# Patient Record
Sex: Female | Born: 1995 | Race: Black or African American | Hispanic: No | Marital: Single | Smoking: Never smoker
Health system: Southern US, Community
[De-identification: ages and names within clinical notes are randomized; demographics above are authoritative.]

## PROBLEM LIST (undated history)

## (undated) ENCOUNTER — Inpatient Hospital Stay (HOSPITAL_COMMUNITY): Payer: Self-pay

## (undated) DIAGNOSIS — F419 Anxiety disorder, unspecified: Secondary | ICD-10-CM

## (undated) DIAGNOSIS — F32A Depression, unspecified: Secondary | ICD-10-CM

## (undated) DIAGNOSIS — F329 Major depressive disorder, single episode, unspecified: Secondary | ICD-10-CM

## (undated) HISTORY — PX: INDUCED ABORTION: SHX677

---
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
## 2016-01-02 ENCOUNTER — Encounter (HOSPITAL_COMMUNITY): Payer: Self-pay | Admitting: *Deleted

## 2016-01-02 ENCOUNTER — Emergency Department (HOSPITAL_COMMUNITY)
Admission: EM | Admit: 2016-01-02 | Discharge: 2016-01-03 | Disposition: A | Discharge status: Other Acute Inpatient Hospital | Payer: 59 | Attending: Emergency Medicine | Admitting: Emergency Medicine

## 2016-01-02 DIAGNOSIS — T391X4A Poisoning by 4-Aminophenol derivatives, undetermined, initial encounter: Secondary | ICD-10-CM | POA: Diagnosis present

## 2016-01-02 DIAGNOSIS — Z79899 Other long term (current) drug therapy: Secondary | ICD-10-CM | POA: Diagnosis not present

## 2016-01-02 DIAGNOSIS — Z79891 Long term (current) use of opiate analgesic: Secondary | ICD-10-CM | POA: Insufficient documentation

## 2016-01-02 DIAGNOSIS — Z792 Long term (current) use of antibiotics: Secondary | ICD-10-CM | POA: Diagnosis not present

## 2016-01-02 LAB — COMPREHENSIVE METABOLIC PANEL
ALT: 15 U/L (ref 14–54)
ANION GAP: 9 (ref 5–15)
AST: 19 U/L (ref 15–41)
Albumin: 4 g/dL (ref 3.5–5.0)
Alkaline Phosphatase: 87 U/L (ref 38–126)
BILIRUBIN TOTAL: 1 mg/dL (ref 0.3–1.2)
BUN: 6 mg/dL (ref 6–20)
CO2: 25 mmol/L (ref 22–32)
Calcium: 9.5 mg/dL (ref 8.9–10.3)
Chloride: 104 mmol/L (ref 101–111)
Creatinine, Ser: 0.73 mg/dL (ref 0.44–1.00)
GFR calc Af Amer: >60 mL/min (ref >60)
Glucose, Bld: 99 mg/dL (ref 65–99)
POTASSIUM: 3.7 mmol/L (ref 3.5–5.1)
Sodium: 138 mmol/L (ref 135–145)
TOTAL PROTEIN: 7.8 g/dL (ref 6.5–8.1)

## 2016-01-02 LAB — SALICYLATE LEVEL

## 2016-01-02 LAB — HEPATIC FUNCTION PANEL
ALK PHOS: 86 U/L (ref 38–126)
ALT: 16 U/L (ref 14–54)
AST: 17 U/L (ref 15–41)
Albumin: 4 g/dL (ref 3.5–5.0)
TOTAL PROTEIN: 7.6 g/dL (ref 6.5–8.1)
Total Bilirubin: 0.8 mg/dL (ref 0.3–1.2)

## 2016-01-02 LAB — RAPID URINE DRUG SCREEN, HOSP PERFORMED
Amphetamines: NOT DETECTED
BENZODIAZEPINES: NOT DETECTED
Barbiturates: NOT DETECTED
COCAINE: NOT DETECTED
Opiates: NOT DETECTED
Tetrahydrocannabinol: NOT DETECTED

## 2016-01-02 LAB — CBC
HCT: 38.6 % (ref 36.0–46.0)
Hemoglobin: 12.5 g/dL (ref 12.0–15.0)
MCH: 28.2 pg (ref 26.0–34.0)
MCHC: 32.4 g/dL (ref 30.0–36.0)
MCV: 87.1 fL (ref 78.0–100.0)
PLATELETS: 334 10*3/uL (ref 150–400)
RBC: 4.43 MIL/uL (ref 3.87–5.11)
RDW: 13.2 % (ref 11.5–15.5)
WBC: 5.6 10*3/uL (ref 4.0–10.5)

## 2016-01-02 LAB — ACETAMINOPHEN LEVEL
ACETAMINOPHEN (TYLENOL), SERUM: 74 ug/mL — AB (ref 10–30)
Acetaminophen (Tylenol), Serum: 83 ug/mL — ABNORMAL HIGH (ref 10–30)

## 2016-01-02 LAB — ETHANOL

## 2016-01-02 NOTE — ED Provider Notes (Signed)
CSN: [id number]     Arrival date & time 01/02/16  1644 History   First MD Initiated Contact with Patient 01/02/16 2122     Chief Complaint  Patient presents with  . Drug Overdose     (Consider location/radiation/quality/duration/timing/severity/associated sxs/prior Treatment) Patient is a 20 y.o. female presenting with general illness. The history is provided by the patient.  Illness Location:  Drug overdose Severity:  Moderate Onset quality:  Sudden Duration:  4 hours Timing:  Constant Progression:  Unchanged Chronicity:  New Context:  No prior psychiatric diagnoses. Took 9 tablets of Tylenol 650 mg extended release around 4 PM. Reports she took this because she wanted to feel better, denies SI, HI, AVH. Associated symptoms: headaches   Associated symptoms: no abdominal pain, no chest pain, no cough, no diarrhea, no fever, no nausea, no shortness of breath and no vomiting     Past Medical History  Diagnosis Date  . Medical history non-contributory    Past Surgical History  Procedure Laterality Date  . No past surgeries     No family history on file. Social History  Substance Use Topics  . Smoking status: Never Smoker   . Smokeless tobacco: None  . Alcohol Use: No   OB History    Gravida Para Term Preterm AB TAB SAB Ectopic Multiple Living   3 1 1  1 1    0 1     Review of Systems  Constitutional: Negative for fever and chills.  Respiratory: Negative for cough and shortness of breath.   Cardiovascular: Negative for chest pain.  Gastrointestinal: Negative for nausea, vomiting, abdominal pain and diarrhea.  Musculoskeletal: Negative for back pain.  Neurological: Positive for headaches. Negative for light-headedness.  All other systems reviewed and are negative.     Allergies  Review of patient's allergies indicates no known allergies.  Home Medications   Prior to Admission medications   Medication Sig Start Date End Date Taking? Authorizing Provider   amoxicillin-clavulanate (AUGMENTIN) 875-125 MG tablet Take 1 tablet by mouth every 12 (twelve) hours. 11/12/15   Danelle BerryLeisa Tapia, PA-C  ciprofloxacin (CIPRO) 500 MG tablet Take 1 tablet (500 mg total) by mouth 2 (two) times daily. One po bid x 7 days 08/11/15   Zadie Rhineonald Wickline, MD  oxyCODONE-acetaminophen (PERCOCET) 5-325 MG tablet Take 1-2 tablets by mouth every 6 (six) hours as needed for severe pain. 11/12/15   Danelle BerryLeisa Tapia, PA-C  phenazopyridine (PYRIDIUM) 200 MG tablet Take 1 tablet (200 mg total) by mouth 3 (three) times daily. 08/11/15   Zadie Rhineonald Wickline, MD   BP 99/58 mmHg  Pulse 75  Temp(Src) 97.8 F (36.6 C) (Oral)  Resp 18  Ht 5\' 1"  (1.549 m)  Wt 59.109 kg  BMI 24.63 kg/m2  SpO2 100%  LMP 12/18/2015 Physical Exam  Constitutional: She is oriented to person, place, and time. She appears well-developed and well-nourished. No distress.  HENT:  Head: Normocephalic and atraumatic.  Eyes: EOM are normal. Pupils are equal, round, and reactive to light.  Neck: Normal range of motion.  Cardiovascular: Normal rate and regular rhythm.   Pulmonary/Chest: No tachypnea. No respiratory distress.  Abdominal: Soft. Normal appearance. There is no tenderness. There is no rebound and no guarding.  Neurological: She is alert and oriented to person, place, and time. GCS eye subscore is 4. GCS verbal subscore is 5. GCS motor subscore is 6.  Skin: Skin is warm and dry.  Psychiatric: Her speech is normal and behavior is normal. Her affect is inappropriate.  She expresses impulsivity and inappropriate judgment.  Smiling throughout the interview. Denies SI, HI, AVH.    ED Course  Procedures (including critical care time) Labs Review Labs Reviewed  ACETAMINOPHEN LEVEL - Abnormal; Notable for the following:    Acetaminophen (Tylenol), Serum 74 (*)    All other components within normal limits  HEPATIC FUNCTION PANEL - Abnormal; Notable for the following:    Bilirubin, Direct <0.1 (*)    All other  components within normal limits  ACETAMINOPHEN LEVEL - Abnormal; Notable for the following:    Acetaminophen (Tylenol), Serum 83 (*)    All other components within normal limits  ACETAMINOPHEN LEVEL - Abnormal; Notable for the following:    Acetaminophen (Tylenol), Serum 36 (*)    All other components within normal limits  COMPREHENSIVE METABOLIC PANEL  ETHANOL  SALICYLATE LEVEL  CBC  URINE RAPID DRUG SCREEN, HOSP PERFORMED  CBG MONITORING, ED    Imaging Review No results found. I have personally reviewed and evaluated these images and lab results as part of my medical decision-making.   EKG Interpretation   Date/Time:  Tuesday Jan 02 2016 17:34:51 EDT Ventricular Rate:  77 PR Interval:  130 QRS Duration: 78 QT Interval:  360 QTC Calculation: 407 R Axis:   71 Text Interpretation:  Normal sinus rhythm Normal ECG No significant change  since last tracing Confirmed by Bebe Shaggy  MD, Dorinda Hill ([id number]) on 01/02/2016  9:26:51 PM      MDM   Final diagnoses:  Tylenol overdose, undetermined intent, initial encounter    20 year old female with no prior psychiatric diagnoses presenting after a Tylenol overdose. Denies any intentional self-harm. Reports that she has been feeling stressed recently, and was "trying to feel better." Reports taking medication around 4 PM. Four-hour level was 83. Per poison control, given the fact that this was an extended release, get another level at 7 hours. He continues to trend down. Patient continues to be asymptomatic. No other coingestants. LFTs normal.  Patient has poor insight into the scenario. Denies suicidal ideation or audiovisual hallucinations. We'll have psychiatry evaluate the patient    Lindalou Hose, MD 01/03/16 1308  Zadie Rhine, MD 01/04/16 [phone]

## 2016-01-02 NOTE — ED Notes (Signed)
No sharps reg diet ordered for the pt

## 2016-01-02 NOTE — ED Notes (Signed)
Staffing aware of need for sitter, Charge RN aware of pts need for sitter, security to wand pt, belongings removed, pt placed in paper scrubs

## 2016-01-02 NOTE — ED Notes (Signed)
Pt reports taking Tylenol today unknown time today, pt states, "Life is going on. I was just trying to ease my pain. Emotional & distress pain. It is like a whole long situation. It is relationship problems. I don't talk to my parents. I had a boyfriend." pts grandmother, Production designer, theatre/television/filmmanager & co workers are in lobby, pt denies SI, pt aware of plan, pt denies street drug & ETOH use, pt took Tylenol at work, calm & cooperative, A&O x4

## 2016-01-02 NOTE — ED Notes (Signed)
Pts grandmother reports that the pt took x9 Tylenol ER 650 mg tabs, Poison Control to be notified

## 2016-01-02 NOTE — ED Notes (Signed)
Spoke with Alona BeneJoyce at poison control, wants repeat Tylenol level around 11pm. If level does not differ much pt should be fine, does not need any more monitoring.

## 2016-01-02 NOTE — ED Notes (Signed)
EDP at bedside  

## 2016-01-02 NOTE — ED Notes (Signed)
Poison Control notified, this RN spoke with Alona BeneJoyce, recommendation to check liver functions & Acetaminophen level @ 19:00 with goal of liver function tests to be WNL & Tylenol level below 150

## 2016-01-03 ENCOUNTER — Observation Stay (HOSPITAL_COMMUNITY)
Admission: AD | Admit: 2016-01-03 | Discharge: 2016-01-04 | Disposition: A | Discharge status: Home / Self Care | Payer: PRIVATE HEALTH INSURANCE | Source: Intra-hospital | Attending: Family | Admitting: Family

## 2016-01-03 ENCOUNTER — Encounter (HOSPITAL_COMMUNITY): Payer: Self-pay

## 2016-01-03 DIAGNOSIS — F331 Major depressive disorder, recurrent, moderate: Principal | ICD-10-CM | POA: Diagnosis present

## 2016-01-03 DIAGNOSIS — T391X4A Poisoning by 4-Aminophenol derivatives, undetermined, initial encounter: Secondary | ICD-10-CM | POA: Diagnosis not present

## 2016-01-03 LAB — ACETAMINOPHEN LEVEL: Acetaminophen (Tylenol), Serum: 36 ug/mL — ABNORMAL HIGH (ref 10–30)

## 2016-01-03 MED ORDER — ALUM & MAG HYDROXIDE-SIMETH 200-200-20 MG/5ML PO SUSP: 30.0000 mL | ORAL | Status: DC | PRN

## 2016-01-03 MED ORDER — ACETAMINOPHEN 325 MG PO TABS: 650.0000 mg | ORAL_TABLET | Freq: Four times a day (QID) | ORAL | Status: DC | PRN

## 2016-01-03 MED ORDER — MAGNESIUM HYDROXIDE 400 MG/5ML PO SUSP: 30.0000 mL | Freq: Every day | ORAL | Status: DC | PRN

## 2016-01-03 MED ORDER — HYDROXYZINE HCL 25 MG PO TABS: 25.0000 mg | ORAL_TABLET | Freq: Four times a day (QID) | ORAL | Status: DC | PRN

## 2016-01-03 NOTE — Plan of Care (Signed)
[facility] Observation Crisis Plan  Reason for Crisis Plan:  Crisis Stabilization   Plan of Care:  Referral for Inpatient Hospitalization  Family Support:      Current Living Environment:  Living Arrangements: Spouse/significant other, Children, Parent  Insurance:   Hospital Account    Name Acct ID Class Status Primary Coverage   Schaefer Schaefer [id number] BEHAVIORAL HEALTH OBSERVATION Open [employer] [facility] [place] [facility] [employer]        Guarantor Account (for Hospital Account [id number])    Name Relation to Pt Service Area Active? Acct Type   Schaefer Schaefer Self [facility]   Address Phone       [address] [place], [place] [address] [phone](H)          Coverage Information (for Hospital Account [id number])    1. UNITED HE[employer]CARE/UNITED BEH[employer]IORAL HEALTH    F/O Payor/Plan Precert #   [facility]    Subscriber Subscriber #   Schaefer CrosbySmyre, Schaefer [id number]   Address Phone   [address] [place], [place] [address] [phone]       2. [place]    F/O Payor/Plan Precert #   [facility]    Subscriber Subscriber #   Schaefer Schaefer [id number]   Address Phone   [address] [place], [place] [address] [phone]          Legal Guardian:     Primary Care Provider:  No PCP Per Patient  Current Outpatient Providers:  None  Psychiatrist:     Counselor/Therapist:     Compliant with Medications: Pt not prescribed any medications currently.  Additional Information:   Schaefer Schaefer, Schaefer Schaefer 5/17/20171:54 PM

## 2016-01-03 NOTE — ED Notes (Signed)
[facility]

## 2016-01-03 NOTE — Progress Notes (Signed)
[facility] INPATIENT:  Family/Significant Other Suicide Prevention Education  Suicide Prevention Education:  Patient Refusal for Family/Significant Other Suicide Prevention Education: The patient [name] Schaefer has refused to provide written consent for family/significant other to be provided Family/Significant Other Suicide Prevention Education during admission and/or prior to discharge.  Physician notified.  Maurine SimmeringShugart, Denetta Fei M 01/03/2016, 1:53 PM

## 2016-01-03 NOTE — ED Notes (Signed)
Please see downtime documentation.
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
## 2016-01-03 NOTE — ED Notes (Signed)
Patient was given a snack and drink. A regular diet ordered for lunch. 

## 2016-01-03 NOTE — ED Notes (Addendum)
Pt states that she will go home with grandmother-- and move out from boyfriend/baby's father house. When asked if she felt safe where she was, pt took a few minutes to answer-- then said "I guess"-- discussed safety plan for home and moving to grandmother's house. Denies having any plan, or wanting to hurt self-- wants to go home to take care of son.
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
## 2016-01-03 NOTE — ED Notes (Signed)
TTS in progress 

## 2016-01-03 NOTE — Progress Notes (Signed)
Shift Note:  Pt in bed at shift change.  Pt awake and watching TV.  Pt is friendly and cooperative.  Pt denies any pain, SI, HI or AV/H.  Pt seen by provider and prn meds ordered. Pt continuously monitored for safety while on unit except when in bathroom.  Pt remains safe.

## 2016-01-03 NOTE — Progress Notes (Signed)
Pt spoke with psych NP this morning- recommendation [facility].  [facility] 6 [facility], report can be called at [phone], pt can arrive anytime.

## 2016-01-04 DIAGNOSIS — F331 Major depressive disorder, recurrent, moderate: Secondary | ICD-10-CM | POA: Diagnosis not present

## 2016-01-04 NOTE — Progress Notes (Signed)
Nursing Discharge Note  AVS complete and patient signing copy for chart and receiving copy for herself along with instructions for followup. No prescriptions written for patient. Patient also receiving personal property from locker and signing Belongings Sheet received. Patient continuing to deny any SI/HI/AVH. Staff member escorting patient to Lobby where her [name] Schaefer is now here waiting to pick her up.
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
## 2016-01-04 NOTE — [facility] Counselor (Signed)
Spoke with patient this a.m. Patient remains positive, and states that she does have plans to move in with grandmother to help reduce social stressors. Also, pt states she would like to receive therapy [has never attended] this was dicussed and explained with pt and process as well. Patient is willing to use college counseling services at her current university to explore additional resources and receive counseling as well. Also, pt given contact info for [facility] to contact for appointment as well. Gustaf Mccarter K. Sherlon HandingHarris, LCAS-A, LPC-A, [facility]  Counselor 01/04/2016 10:37 AM
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
## 2016-01-04 NOTE — Progress Notes (Signed)
Nursing Note  Provider Claudette Headonrad Withrow in, discussing discharge with patient and two staff members on Unit, states AVS will be ready soon.

## 2016-01-04 NOTE — [facility] Counselor (Signed)
Spoke with grandmother of pt.  per Eugenie BirksWhithrow, NP to confirm and discuss contract for safety. Discussion pertained to safety of environment upon returning home. Grandmother confirmed.  [name] Schaefer, LCAS-A, LPC-A, [facility]  Counselor 01/04/2016 3:39 PM

## 2016-01-04 NOTE — Discharge Instructions (Signed)
[facility] are [facility]. You are also encouraged to [facility] [employer].

## 2016-01-04 NOTE — Progress Notes (Signed)
Nursing Shift Note  Patient has called her Grandmother to see what her schedule is and what her available [facility] is [facility]. Grandmother telling patient that she can pick patient up "by twelve o'clock". [facility] information, who is verbally telling Provider of this info, conversation audible to Nurse.

## 2016-01-04 NOTE — Progress Notes (Signed)
Nursing Shift Note  Patient sitting up in bed, is cheerful, pleasant, cooperative, logical in conversation and content. Patient denies any SI/HI/AVH, just expressing a little bit of nervousness about leaving her significant other with their baby to live with her Grandmother but states she believes this is best all around for her and her future and also her one year old child's.  Patient stating she is ready to be discharged and is exhibiting enthusiasm about her new living plans. Nurse providing emotional support, therapeutic communication and ensuring continuous observation of patient for safety except when patient in the bathroom. Patient remains safe on unit.

## 2016-02-27 ENCOUNTER — Encounter (HOSPITAL_COMMUNITY): Payer: Self-pay | Admitting: *Deleted

## 2016-02-27 ENCOUNTER — Inpatient Hospital Stay (HOSPITAL_COMMUNITY)
Admission: AD | Admit: 2016-02-27 | Discharge: 2016-02-27 | Disposition: A | Discharge status: Home / Self Care | Payer: 59 | Source: Ambulatory Visit | Attending: Obstetrics & Gynecology | Admitting: Obstetrics & Gynecology

## 2016-02-27 DIAGNOSIS — R1030 Lower abdominal pain, unspecified: Secondary | ICD-10-CM | POA: Diagnosis not present

## 2016-02-27 DIAGNOSIS — Z3202 Encounter for pregnancy test, result negative: Secondary | ICD-10-CM | POA: Insufficient documentation

## 2016-02-27 DIAGNOSIS — R109 Unspecified abdominal pain: Secondary | ICD-10-CM | POA: Diagnosis not present

## 2016-02-27 DIAGNOSIS — F418 Other specified anxiety disorders: Secondary | ICD-10-CM | POA: Insufficient documentation

## 2016-02-27 DIAGNOSIS — R300 Dysuria: Secondary | ICD-10-CM

## 2016-02-27 HISTORY — DX: Anxiety disorder, unspecified: F41.9

## 2016-02-27 HISTORY — DX: Major depressive disorder, single episode, unspecified: F32.9

## 2016-02-27 HISTORY — DX: Depression, unspecified: F32.A

## 2016-02-27 LAB — URINALYSIS, ROUTINE W REFLEX MICROSCOPIC
BILIRUBIN URINE: NEGATIVE
GLUCOSE, UA: NEGATIVE mg/dL
HGB URINE DIPSTICK: NEGATIVE
Ketones, ur: NEGATIVE mg/dL
Nitrite: NEGATIVE
PROTEIN: NEGATIVE mg/dL
SPECIFIC GRAVITY, URINE: 1.025 (ref 1.005–1.030)
pH: 6 (ref 5.0–8.0)

## 2016-02-27 LAB — WET PREP, GENITAL
CLUE CELLS WET PREP: NONE SEEN
Sperm: NONE SEEN
TRICH WET PREP: NONE SEEN
YEAST WET PREP: NONE SEEN

## 2016-02-27 LAB — URINE MICROSCOPIC-ADD ON
BACTERIA UA: NONE SEEN
RBC / HPF: NONE SEEN RBC/hpf (ref 0–5)

## 2016-02-27 LAB — POCT PREGNANCY, URINE: Preg Test, Ur: NEGATIVE

## 2016-02-27 MED ORDER — SULFAMETHOXAZOLE-TRIMETHOPRIM 800-160 MG PO TABS: 1.0000 | ORAL_TABLET | Freq: Two times a day (BID) | ORAL | Status: AC

## 2016-02-27 NOTE — MAU Provider Note (Signed)
CSN: [id number]     Arrival date & time 02/27/16  1427 History   None    Chief Complaint  Patient presents with  . Abdominal Pain  . Possible Pregnancy     (Consider location/radiation/quality/duration/timing/severity/associated sxs/prior Treatment) Dysuria  This is a new problem. The current episode started in the past 7 days. The problem occurs every urination. The problem has been gradually worsening. The quality of the pain is described as burning. The pain is at a severity of 8/10. There has been no fever. She is sexually active. There is no history of pyelonephritis. Associated symptoms include a discharge and a possible pregnancy. Pertinent negatives include no chills, nausea, urgency or vomiting. She has tried nothing for the symptoms.   [name] Schaefer is a 20 y.o. [id number] with dysuria, vaginal d/c and lower abdominal pain that started 4 days ago. LMP was 5/17, no birth control. TAB x2 last one in Feb 2017. Last pap smear one year ago and was normal. Hx of trichomonas. Current sex partner x 2 years.   Past Medical History  Diagnosis Date  . Anxiety   . Depression    Past Surgical History  Procedure Laterality Date  . Induced abortion     Family History  Problem Relation Age of Onset  . Diabetes Paternal Grandfather   . Stroke Paternal Grandfather   . Hypertension Paternal Grandfather    Social History  Substance Use Topics  . Smoking status: Never Smoker   . Smokeless tobacco: Never Used  . Alcohol Use: No   OB History    Gravida Para Term Preterm AB TAB SAB Ectopic Multiple Living   3 1 1  1 1    0 1     Review of Systems  Constitutional: Negative for chills.  Gastrointestinal: Positive for abdominal pain. Negative for nausea and vomiting.  Genitourinary: Positive for dysuria and vaginal discharge. Negative for urgency, decreased urine volume and dyspareunia.  all other systems negative    Allergies  Review of patient's allergies indicates no known  allergies.  Home Medications   Prior to Admission medications   Medication Sig Start Date End Date Taking? Authorizing Provider  sulfamethoxazole-trimethoprim (BACTRIM DS,SEPTRA DS) 800-160 MG tablet Take 1 tablet by mouth 2 (two) times daily. 02/27/16 03/05/16  Hope Orlene OchM Neese, NP   BP 109/65 mmHg  Pulse 96  Temp(Src) 99.4 F (37.4 C) (Oral)  Resp 16  Ht 5' (1.524 m)  Wt 126 lb 12.8 oz (57.516 kg)  BMI 24.76 kg/m2  LMP 01/15/2015 Physical Exam  Constitutional: She is oriented to person, place, and time. She appears well-developed and well-nourished. No distress.  HENT:  Head: Normocephalic.  Eyes: Conjunctivae and EOM are normal.  Neck: Neck supple.  Cardiovascular: Normal rate and regular rhythm.   Pulmonary/Chest: Effort normal and breath sounds normal.  Abdominal: Soft. Bowel sounds are normal. There is tenderness in the suprapubic area. There is no rebound, no guarding and no CVA tenderness.  Tenderness is minimal.  Genitourinary:  External genitalia without lesions, large amount of yellow d/c vaginal vault. No CMT, no adnexal tenderness. Uterus without palpable enlargement.   Musculoskeletal: Normal range of motion.  Neurological: She is alert and oriented to person, place, and time. No cranial nerve deficit.  Skin: Skin is warm and dry.  Psychiatric: She has a normal mood and affect. Her behavior is normal.  Nursing note and vitals reviewed.   ED Course  Procedures (including critical care time) Labs Review Results for orders  placed or performed during the hospital encounter of 02/27/16 (from the past 24 hour(s))  Urinalysis, Routine w reflex microscopic (not at [facility])     Status: Abnormal   Collection Time: 02/27/16  2:40 PM  Result Value Ref Range   Color, Urine YELLOW YELLOW   APPearance HAZY (A) CLEAR   Specific Gravity, Urine 1.025 1.005 - 1.030   pH 6.0 5.0 - 8.0   Glucose, UA NEGATIVE NEGATIVE mg/dL   Hgb urine dipstick NEGATIVE NEGATIVE   Bilirubin Urine  NEGATIVE NEGATIVE   Ketones, ur NEGATIVE NEGATIVE mg/dL   Protein, ur NEGATIVE NEGATIVE mg/dL   Nitrite NEGATIVE NEGATIVE   Leukocytes, UA SMALL (A) NEGATIVE  Urine microscopic-add on     Status: Abnormal   Collection Time: 02/27/16  2:40 PM  Result Value Ref Range   Squamous Epithelial / LPF 0-5 (A) NONE SEEN   WBC, UA 6-30 0 - 5 WBC/hpf   RBC / HPF NONE SEEN 0 - 5 RBC/hpf   Bacteria, UA NONE SEEN NONE SEEN   Urine-Other MUCOUS PRESENT   Pregnancy, urine POC     Status: None   Collection Time: 02/27/16  4:17 PM  Result Value Ref Range   Preg Test, Ur NEGATIVE NEGATIVE  Wet prep, genital     Status: Abnormal   Collection Time: 02/27/16  4:40 PM  Result Value Ref Range   Yeast Wet Prep HPF POC NONE SEEN NONE SEEN   Trich, Wet Prep NONE SEEN NONE SEEN   Clue Cells Wet Prep HPF POC NONE SEEN NONE SEEN   WBC, Wet Prep HPF POC MANY (A) NONE SEEN   Sperm NONE SEEN      Imaging Review No results found. I have personally reviewed and evaluated the lab results as part of my medical decision-making.   MDM  20 y.o. female with vaginal d/c and dysuria stable for d/c without pain on pelvic exam, no fever or other signs of PID. Will treat for early UTI and send urine for culture. Discussed with the patient and all questioned fully answered. She will return if any problems arise.   Final diagnoses:  Dysuria

## 2016-02-27 NOTE — MAU Note (Signed)
(  Pt had gone outside.  Another pt went and got her)  Hasn't had her period.  Stings when she pees and is cramping really bad.  Neg HPT

## 2016-02-27 NOTE — Progress Notes (Signed)
Written and verbal d/c instructions given and understanding voiced. 

## 2016-02-27 NOTE — MAU Note (Signed)
E-sign not working in [employer]. Pt given d/c instructions and understanding voiced and then pt d/c home

## 2016-02-27 NOTE — Discharge Instructions (Signed)

## 2016-02-28 LAB — GC/CHLAMYDIA PROBE AMP (~~LOC~~) NOT AT ARMC
CHLAMYDIA, DNA PROBE: NEGATIVE
NEISSERIA GONORRHEA: POSITIVE — AB

## 2016-02-29 LAB — URINE CULTURE

## 2016-03-01 ENCOUNTER — Ambulatory Visit (INDEPENDENT_AMBULATORY_CARE_PROVIDER_SITE_OTHER): Payer: Self-pay

## 2016-03-01 VITALS — BP 110/68 | HR 82

## 2016-03-01 DIAGNOSIS — A549 Gonococcal infection, unspecified: Secondary | ICD-10-CM

## 2016-03-01 DIAGNOSIS — Z202 Contact with and (suspected) exposure to infections with a predominantly sexual mode of transmission: Secondary | ICD-10-CM

## 2016-03-01 MED ORDER — CEFTRIAXONE SODIUM 1 G IJ SOLR
1.0000 g | Freq: Once | INTRAMUSCULAR | Status: AC
  Administered 2016-03-01: 1 g via INTRAMUSCULAR

## 2016-03-01 MED ORDER — AZITHROMYCIN 250 MG PO TABS
1000.0000 mg | ORAL_TABLET | Freq: Once | ORAL | Status: AC
  Administered 2016-03-01: 1000 mg via ORAL

## 2016-03-01 MED ORDER — CEFTRIAXONE SODIUM 250 MG IJ SOLR: 250.0000 mg | Freq: Once | INTRAMUSCULAR | Status: DC

## 2016-03-01 NOTE — Progress Notes (Signed)
Patient presented today for STD treatment. Patient was diagnosis with Gonorrhea. Patient has been given injection and taken her Azithromycin 1 gram. Patient was advised not to have sex for 10 days and to inform all partners. STD card has been completed and fax to the health department.

## 2016-03-01 NOTE — Addendum Note (Signed)
Addended by: Cheree DittoGRAHAM, Tawonna Esquer A on: 03/01/2016 11:16 AM   Modules accepted: Orders

## 2016-03-01 NOTE — Addendum Note (Signed)
Addended by: Cheree DittoGRAHAM, DEMETRICE A on: 03/01/2016 11:19 AM   Modules accepted: Orders

## 2016-03-01 NOTE — Addendum Note (Signed)
Addended by: Cheree DittoGRAHAM, Keenya Matera A on: 03/01/2016 11:52 AM   Modules accepted: Orders, Medications

## 2016-04-26 ENCOUNTER — Encounter: Payer: Self-pay | Admitting: *Deleted

## 2016-05-09 ENCOUNTER — Inpatient Hospital Stay (HOSPITAL_COMMUNITY)
Admission: AD | Admit: 2016-05-09 | Discharge: 2016-05-09 | Disposition: A | Discharge status: Home / Self Care | Payer: 59 | Source: Ambulatory Visit | Attending: Obstetrics & Gynecology | Admitting: Obstetrics & Gynecology

## 2016-05-09 ENCOUNTER — Inpatient Hospital Stay (HOSPITAL_COMMUNITY): Payer: 59

## 2016-05-09 ENCOUNTER — Encounter (HOSPITAL_COMMUNITY): Payer: Self-pay

## 2016-05-09 DIAGNOSIS — Z8249 Family history of ischemic heart disease and other diseases of the circulatory system: Secondary | ICD-10-CM | POA: Diagnosis not present

## 2016-05-09 DIAGNOSIS — Z3201 Encounter for pregnancy test, result positive: Secondary | ICD-10-CM | POA: Insufficient documentation

## 2016-05-09 DIAGNOSIS — Z3A01 Less than 8 weeks gestation of pregnancy: Secondary | ICD-10-CM | POA: Insufficient documentation

## 2016-05-09 DIAGNOSIS — O26891 Other specified pregnancy related conditions, first trimester: Secondary | ICD-10-CM | POA: Diagnosis present

## 2016-05-09 DIAGNOSIS — R102 Pelvic and perineal pain: Secondary | ICD-10-CM | POA: Insufficient documentation

## 2016-05-09 DIAGNOSIS — Z823 Family history of stroke: Secondary | ICD-10-CM | POA: Diagnosis not present

## 2016-05-09 DIAGNOSIS — O209 Hemorrhage in early pregnancy, unspecified: Secondary | ICD-10-CM

## 2016-05-09 DIAGNOSIS — Z8619 Personal history of other infectious and parasitic diseases: Secondary | ICD-10-CM | POA: Insufficient documentation

## 2016-05-09 DIAGNOSIS — O4691 Antepartum hemorrhage, unspecified, first trimester: Secondary | ICD-10-CM | POA: Diagnosis not present

## 2016-05-09 DIAGNOSIS — B9689 Other specified bacterial agents as the cause of diseases classified elsewhere: Secondary | ICD-10-CM

## 2016-05-09 DIAGNOSIS — Z833 Family history of diabetes mellitus: Secondary | ICD-10-CM | POA: Diagnosis not present

## 2016-05-09 DIAGNOSIS — N760 Acute vaginitis: Secondary | ICD-10-CM | POA: Diagnosis not present

## 2016-05-09 DIAGNOSIS — O3680X0 Pregnancy with inconclusive fetal viability, not applicable or unspecified: Secondary | ICD-10-CM | POA: Insufficient documentation

## 2016-05-09 LAB — URINALYSIS, ROUTINE W REFLEX MICROSCOPIC
BILIRUBIN URINE: NEGATIVE
GLUCOSE, UA: NEGATIVE mg/dL
HGB URINE DIPSTICK: NEGATIVE
KETONES UR: NEGATIVE mg/dL
LEUKOCYTES UA: NEGATIVE
Nitrite: NEGATIVE
PH: 6.5 (ref 5.0–8.0)
Protein, ur: NEGATIVE mg/dL
Specific Gravity, Urine: 1.01 (ref 1.005–1.030)

## 2016-05-09 LAB — CBC WITH DIFFERENTIAL/PLATELET
BASOS ABS: 0 10*3/uL (ref 0.0–0.1)
Basophils Relative: 0 %
EOS PCT: 2 %
Eosinophils Absolute: 0.1 10*3/uL (ref 0.0–0.7)
HEMATOCRIT: 35.7 % — AB (ref 36.0–46.0)
Hemoglobin: 12.3 g/dL (ref 12.0–15.0)
LYMPHS PCT: 35 %
Lymphs Abs: 1.9 10*3/uL (ref 0.7–4.0)
MCH: 28.8 pg (ref 26.0–34.0)
MCHC: 34.5 g/dL (ref 30.0–36.0)
MCV: 83.6 fL (ref 78.0–100.0)
Monocytes Absolute: 0.4 10*3/uL (ref 0.1–1.0)
Monocytes Relative: 7 %
Neutro Abs: 3.1 10*3/uL (ref 1.7–7.7)
Neutrophils Relative %: 56 %
Platelets: 247 10*3/uL (ref 150–400)
RBC: 4.27 MIL/uL (ref 3.87–5.11)
RDW: 13.9 % (ref 11.5–15.5)
WBC: 5.4 10*3/uL (ref 4.0–10.5)

## 2016-05-09 LAB — WET PREP, GENITAL
SPERM: NONE SEEN
TRICH WET PREP: NONE SEEN
Yeast Wet Prep HPF POC: NONE SEEN

## 2016-05-09 LAB — POCT PREGNANCY, URINE: Preg Test, Ur: POSITIVE — AB

## 2016-05-09 LAB — HCG, QUANTITATIVE, PREGNANCY: HCG, BETA CHAIN, QUANT, S: 1285 m[IU]/mL — AB (ref <5)

## 2016-05-09 MED ORDER — METRONIDAZOLE 500 MG PO TABS: 500.0000 mg | ORAL_TABLET | Freq: Two times a day (BID) | ORAL | 0 refills | Status: DC

## 2016-05-09 NOTE — Discharge Instructions (Signed)
Return to care  °· If you have heavier bleeding that soaks through more that 2 pads per hour for an hour or more °· If you bleed so much that you feel like you might pass out or you do pass out °· If you have significant abdominal pain that is not improved with Tylenol  °· If you develop a fever > 100.5 ° °

## 2016-05-09 NOTE — MAU Provider Note (Signed)
[facility]    Provider Note   CSN: [id number] Arrival date & time: 05/09/16  1050     History   Chief Complaint Chief Complaint  Patient presents with  . Abdominal Cramping  . Vaginal Bleeding    HPI [name] Schaefer is a 20 y.o. G3P1011 who presents to the MAU with cramping in her lower abdomen. LMP 04/02/16. No birth control. Stopped OC 5/17. Current sex partner x 2 years. Hx of trichomonas. TAB x1.  Abdominal Cramping  This is a new problem. The current episode started in the past 7 days. The onset quality is gradual. The problem occurs intermittently. The problem has been gradually worsening. The pain is located in the RLQ and LLQ. The pain is at a severity of 8/10. The quality of the pain is cramping. The abdominal pain radiates to the back. Associated symptoms include frequency, headaches and nausea. Pertinent negatives include no anorexia, constipation, diarrhea, dysuria, fever, vomiting or weight loss. Nothing aggravates the pain. The pain is relieved by nothing. She has tried acetaminophen for the symptoms. The treatment provided no relief.  Vaginal Bleeding  The patient's primary symptoms include pelvic pain. Associated symptoms include abdominal pain, back pain, frequency, headaches, nausea and rash. Pertinent negatives include no anorexia, constipation, diarrhea, dysuria, fever or vomiting.    Past Medical History:  Diagnosis Date  . Anxiety   . Depression     Patient Active Problem List   Diagnosis Date Noted  . MDD (major depressive disorder), recurrent episode, moderate (HCC) 01/03/2016  . Labor and delivery, indication for care 11/27/2014  . NVD (normal vaginal delivery) 11/27/2014  . Cervical abnormality affecting pregnancy, antepartum   . Cervix, short (affecting pregnancy)   . Preterm contractions 09/02/2014  . Polyhydramnios in second trimester   . Encounter for fetal anatomic survey   . [redacted] weeks gestation of pregnancy   . Cervical shortening      Past Surgical History:  Procedure Laterality Date  . INDUCED ABORTION      OB History    Gravida Para Term Preterm AB Living   3 1 1   1 1    SAB TAB Ectopic Multiple Live Births     1   0 1       Home Medications    Prior to Admission medications   Not on File    Family History Family History  Problem Relation Age of Onset  . Diabetes Paternal Grandfather   . Stroke Paternal Grandfather   . Hypertension Paternal Grandfather     Social History Social History  Substance Use Topics  . Smoking status: Never Smoker  . Smokeless tobacco: Never Used  . Alcohol use No     Allergies   Review of patient's allergies indicates no known allergies.   Review of Systems Review of Systems  Constitutional: Negative for fever and weight loss.  Eyes: Negative for redness and visual disturbance.  Respiratory: Negative for cough and shortness of breath.   Cardiovascular: Negative for chest pain, palpitations and leg swelling.  Gastrointestinal: Positive for abdominal pain and nausea. Negative for anorexia, constipation, diarrhea and vomiting.  Genitourinary: Positive for frequency, pelvic pain and vaginal bleeding. Negative for dysuria.  Musculoskeletal: Positive for back pain.  Skin: Positive for rash.  Neurological: Positive for headaches. Negative for syncope and light-headedness.  Psychiatric/Behavioral: Negative for confusion. The patient is not nervous/anxious.      Physical Exam Updated Vital Signs BP 111/63   Pulse 82  Temp 98.8 F (37.1 C) (Oral)   Resp 18   Ht 5\' 1"  (1.549 m)   Wt 129 lb 12.8 oz (58.9 kg)   LMP 04/07/2016   BMI 24.53 kg/m   Physical Exam  Constitutional: She is oriented to person, place, and time. She appears well-developed and well-nourished. No distress.  HENT:  Head: Normocephalic and atraumatic.  Eyes: EOM are normal.  Neck: Neck supple.  Cardiovascular: Normal rate and regular rhythm.   Pulmonary/Chest: Effort normal and  breath sounds normal.  Abdominal: Soft. She exhibits no distension. There is tenderness in the right lower quadrant, suprapubic area and left lower quadrant. There is no rebound and no guarding.  Genitourinary:  Genitourinary Comments: External genitalia without lesions, white d/c vaginal vault, mild CMT and bilateral adnexal tenderness that is mild. Uterus slightly enlarged.   Musculoskeletal: Normal range of motion.  Neurological: She is alert and oriented to person, place, and time. No cranial nerve deficit.  Skin: Skin is warm and dry.  Psychiatric: She has a normal mood and affect. Her behavior is normal.  Nursing note and vitals reviewed.    ED Treatments / Results  Labs (all labs ordered are listed, but only abnormal results are displayed) Labs Reviewed  WET PREP, GENITAL - Abnormal; Notable for the following:       Result Value   Clue Cells Wet Prep HPF POC PRESENT (*)    WBC, Wet Prep HPF POC FEW (*)    All other components within normal limits  CBC WITH DIFFERENTIAL/PLATELET - Abnormal; Notable for the following:    HCT 35.7 (*)    All other components within normal limits  POCT PREGNANCY, URINE - Abnormal; Notable for the following:    Preg Test, Ur POSITIVE (*)    All other components within normal limits  URINALYSIS, ROUTINE W REFLEX MICROSCOPIC (NOT AT [facility])  RPR  HIV ANTIBODY (ROUTINE TESTING)  HCG, QUANTITATIVE, PREGNANCY  GC/CHLAMYDIA PROBE AMP ([place]) NOT AT [facility]     Radiology US Ob Comp Less 14 Wks  Result Date: 05/09/2016 CLINICAL DATA:  Cramping and spotting for 3 days EXAM: OBSTETRIC <14 WK [place] AND TRANSVAGINAL OB US TECHNIQUE: Both transabdominal and transvaginal ultrasound examinations were performed for complete evaluation of the gestation as well as the maternal uterus, adnexal regions, and pelvic cul-de-sac. Transvaginal technique was performed to assess early pregnancy. COMPARISON:  None. FINDINGS: Intrauterine gestational sac: Present Yolk sac:   Not visualized Embryo:  Absent MSD: 2  mm   4 w   6  d Subchorionic hemorrhage: Mild to moderate subchorionic hemorrhage is noted. Maternal uterus/adnexae: Uterus is otherwise within normal limits. Cystic structure is noted in the left ovary consistent with the corpus luteum cyst. IMPRESSION: Very early gestational sac as described. Fetal pole is not noted at this time. Followup examination is recommended as well as correlation with beta HCG values. Electronically Signed   By: Alcide Clever M.D.   On: 05/09/2016 14:40   US Ob Transvaginal  Result Date: 05/09/2016 CLINICAL DATA:  Cramping and spotting for 3 days EXAM: OBSTETRIC <14 WK [place] AND TRANSVAGINAL OB US TECHNIQUE: Both transabdominal and transvaginal ultrasound examinations were performed for complete evaluation of the gestation as well as the maternal uterus, adnexal regions, and pelvic cul-de-sac. Transvaginal technique was performed to assess early pregnancy. COMPARISON:  None. FINDINGS: Intrauterine gestational sac: Present Yolk sac:  Not visualized Embryo:  Absent MSD: 2  mm   4 w   6  d  Subchorionic hemorrhage: Mild to moderate subchorionic hemorrhage is noted. Maternal uterus/adnexae: Uterus is otherwise within normal limits. Cystic structure is noted in the left ovary consistent with the corpus luteum cyst. IMPRESSION: Very early gestational sac as described. Fetal pole is not noted at this time. Followup examination is recommended as well as correlation with beta HCG values. Electronically Signed   By: Alcide Clever M.D.   On: 05/09/2016 14:40    Procedures Procedures (including critical care time)  Medications Ordered in ED Medications - No data to display   Initial Impression / Assessment and Plan / ED Course  I have reviewed the triage vital signs and the nursing notes.  Pertinent labs & imaging results that were available during my care of the patient were reviewed by me and considered in my medical decision making (see chart for  details).  Clinical Course   Patient with positive pregnancy test and lower abdominal pain stable to await ultrasound and care with another provider.  Care turned over to Judeth Horn, NP @ 1340.   Final Clinical Impressions(s) / ED Diagnoses  A;  1. Pregnancy of unknown anatomic location   2. Pelvic pain affecting pregnancy in first trimester, antepartum   3. Vaginal bleeding in pregnancy, first trimester   4. BV (bacterial vaginosis)     P: Discharge home Return to MAU Saturday for f/u [facility] Discussed reasons to return to MAU RX flagyl for BV

## 2016-05-09 NOTE — MAU Note (Signed)
Pt reports she has had cramping on and off x 3 days and started spotting today. Had a positive HPT last week.

## 2016-05-10 LAB — HIV ANTIBODY (ROUTINE TESTING W REFLEX): HIV SCREEN 4TH GENERATION: NONREACTIVE

## 2016-05-10 LAB — GC/CHLAMYDIA PROBE AMP (~~LOC~~) NOT AT ARMC
Chlamydia: NEGATIVE
Neisseria Gonorrhea: NEGATIVE

## 2016-05-10 LAB — RPR: RPR Ser Ql: NONREACTIVE

## 2016-07-17 ENCOUNTER — Inpatient Hospital Stay (HOSPITAL_COMMUNITY)
Admission: AD | Admit: 2016-07-17 | Discharge: 2016-07-17 | Disposition: A | Discharge status: Home / Self Care | Payer: 59 | Source: Ambulatory Visit | Attending: Obstetrics & Gynecology | Admitting: Obstetrics & Gynecology

## 2016-07-17 ENCOUNTER — Encounter (HOSPITAL_COMMUNITY): Payer: Self-pay | Admitting: *Deleted

## 2016-07-17 DIAGNOSIS — B373 Candidiasis of vulva and vagina: Secondary | ICD-10-CM | POA: Diagnosis not present

## 2016-07-17 DIAGNOSIS — R109 Unspecified abdominal pain: Secondary | ICD-10-CM | POA: Insufficient documentation

## 2016-07-17 DIAGNOSIS — N898 Other specified noninflammatory disorders of vagina: Secondary | ICD-10-CM | POA: Insufficient documentation

## 2016-07-17 DIAGNOSIS — B3731 Acute candidiasis of vulva and vagina: Secondary | ICD-10-CM

## 2016-07-17 DIAGNOSIS — Z0001 Encounter for general adult medical examination with abnormal findings: Secondary | ICD-10-CM | POA: Insufficient documentation

## 2016-07-17 DIAGNOSIS — O26892 Other specified pregnancy related conditions, second trimester: Secondary | ICD-10-CM

## 2016-07-17 LAB — WET PREP, GENITAL
Clue Cells Wet Prep HPF POC: NONE SEEN
Sperm: NONE SEEN
TRICH WET PREP: NONE SEEN

## 2016-07-17 LAB — URINALYSIS, ROUTINE W REFLEX MICROSCOPIC
BILIRUBIN URINE: NEGATIVE
Glucose, UA: NEGATIVE mg/dL
KETONES UR: NEGATIVE mg/dL
NITRITE: NEGATIVE
PH: 7 (ref 5.0–8.0)
PROTEIN: NEGATIVE mg/dL
Specific Gravity, Urine: 1.025 (ref 1.005–1.030)

## 2016-07-17 LAB — URINE MICROSCOPIC-ADD ON
BACTERIA UA: NONE SEEN
RBC / HPF: NONE SEEN RBC/hpf (ref 0–5)

## 2016-07-17 MED ORDER — TERCONAZOLE 0.4 % VA CREA: 1.0000 | TOPICAL_CREAM | Freq: Every day | VAGINAL | 0 refills | Status: DC

## 2016-07-17 NOTE — MAU Provider Note (Signed)
History   Patient [name] Schaefer Is a 20 year old G3P1011 here with backache, cramping and discharge. She has had cramping and backache for two weeks and the discharge started last night. She came in to be checked out.   CSN: [id number]  Arrival date and time: 07/17/16 1140   None     Chief Complaint  Patient presents with  . Abdominal Cramping  . Vaginal Discharge   Abdominal Cramping  This is a chronic problem. The current episode started 1 to 4 weeks ago. The onset quality is gradual. The problem occurs intermittently. The problem has been unchanged. The pain is mild. The quality of the pain is aching. Pertinent negatives include no anorexia, arthralgias, belching, constipation, diarrhea, dysuria, fever, flatus, frequency, headaches, hematochezia, hematuria, melena, myalgias, nausea, vomiting or weight loss. Nothing aggravates the pain. The pain is relieved by nothing. She has tried nothing for the symptoms.  Vaginal Discharge  The patient's primary symptoms include vaginal discharge. The patient's pertinent negatives include no genital itching, genital lesions, genital odor, genital rash, missed menses, pelvic pain or vaginal bleeding. This is a new problem. The current episode started yesterday. The patient is experiencing no pain. She is pregnant. Pertinent negatives include no anorexia, constipation, diarrhea, dysuria, fever, frequency, headaches, hematuria, nausea or vomiting. The vaginal discharge was thick. There has been no bleeding. She has not been passing clots. She has not been passing tissue. Nothing aggravates the symptoms. She has tried nothing for the symptoms.    OB History    Gravida Para Term Preterm AB Living   3 1 1   1 1    SAB TAB Ectopic Multiple Live Births     1   0 1      Past Medical History:  Diagnosis Date  . Anxiety   . Depression     Past Surgical History:  Procedure Laterality Date  . INDUCED ABORTION      Family History  Problem Relation  Age of Onset  . Diabetes Paternal Grandfather   . Stroke Paternal Grandfather   . Hypertension Paternal Grandfather     Social History  Substance Use Topics  . Smoking status: Never Smoker  . Smokeless tobacco: Never Used  . Alcohol use No    Allergies: No Known Allergies  Prescriptions Prior to Admission  Medication Sig Dispense Refill Last Dose  . metroNIDAZOLE (FLAGYL) 500 MG tablet Take 1 tablet (500 mg total) by mouth 2 (two) times daily. 14 tablet 0     Review of Systems  Constitutional: Negative for fever and weight loss.  HENT: Negative.   Eyes: Negative.   Respiratory: Negative.   Cardiovascular: Negative.   Gastrointestinal: Negative.  Negative for anorexia, constipation, diarrhea, flatus, hematochezia, melena, nausea and vomiting.  Genitourinary: Positive for vaginal discharge. Negative for dysuria, frequency, hematuria, missed menses and pelvic pain.  Musculoskeletal: Negative.  Negative for arthralgias and myalgias.  Skin: Negative.   Neurological: Negative for headaches.  Psychiatric/Behavioral: Negative.    Physical Exam   Blood pressure 92/56, pulse 89, temperature 98.5 F (36.9 C), temperature source Oral, resp. rate 16, weight 55.6 kg (122 lb 9.6 oz), last menstrual period 04/07/2016, unknown if currently breastfeeding.  Physical Exam  Constitutional: She appears well-developed.  HENT:  Head: Normocephalic.  Neck: Normal range of motion.  Respiratory: Effort normal. No respiratory distress. She has no wheezes.  GI: Soft. She exhibits no distension and no mass. There is no tenderness. There is no rebound and no guarding.  Genitourinary: Vaginal discharge found.  Genitourinary Comments: External labia erythematous; no lesions present. Vaginal walls erythematous with adherent white discharge. Cervix is erythematous but no discharge. White disharge with no odor present in the vaginal vault.     MAU Course  Procedures  MDM -Pelvic -wet prep -GC CT  cultures   Assessment and Plan  -wet prep shows yeast -discharge home with RX for terconazole cream  Charlesetta GaribaldiKathryn Lorraine Jaslen Adcox CNM 07/17/2016, 1:05 PM

## 2016-07-17 NOTE — Discharge Instructions (Signed)
Abdominal Pain During Pregnancy °Belly (abdominal) pain is common during pregnancy. Most of the time, it is not a serious problem. Other times, it can be a sign that something is wrong with the pregnancy. Always tell your doctor if you have belly pain. °Follow these instructions at home: °Monitor your belly pain for any changes. The following actions may help you feel better: °· Do not have sex (intercourse) or put anything in your vagina until you feel better. °· Rest until your pain stops. °· Drink clear fluids if you feel sick to your stomach (nauseous). Do not eat solid food until you feel better. °· Only take medicine as told by your doctor. °· Keep all doctor visits as told. °Get help right away if: °· You are bleeding, leaking fluid, or pieces of tissue come out of your vagina. °· You have more pain or cramping. °· You keep throwing up (vomiting). °· You have pain when you pee (urinate) or have blood in your pee. °· You have a fever. °· You do not feel your baby moving as much. °· You feel very weak or feel like passing out. °· You have trouble breathing, with or without belly pain. °· You have a very bad headache and belly pain. °· You have fluid leaking from your vagina and belly pain. °· You keep having watery poop (diarrhea). °· Your belly pain does not go away after resting, or the pain gets worse. °This information is not intended to replace advice given to you by your health care provider. Make sure you discuss any questions you have with your health care provider. °Document Released: 07/24/2009 Document Revised: 03/13/2016 Document Reviewed: 03/04/2013 °Elsevier Interactive Patient Education © 2017 [employer] ° °

## 2016-07-17 NOTE — MAU Note (Signed)
Been cramping really, really bad for 2-3 wks.  Has a weird d/c, first noted 2 days ago.

## 2016-07-18 LAB — GC/CHLAMYDIA PROBE AMP (~~LOC~~) NOT AT ARMC
Chlamydia: NEGATIVE
NEISSERIA GONORRHEA: NEGATIVE

## 2017-01-16 ENCOUNTER — Emergency Department (HOSPITAL_COMMUNITY)
Admission: EM | Admit: 2017-01-16 | Discharge: 2017-01-16 | Disposition: A | Discharge status: Home / Self Care | Payer: Self-pay | Attending: Emergency Medicine | Admitting: Emergency Medicine

## 2017-01-16 ENCOUNTER — Encounter (HOSPITAL_COMMUNITY): Payer: Self-pay | Admitting: Emergency Medicine

## 2017-01-16 ENCOUNTER — Emergency Department (HOSPITAL_COMMUNITY): Payer: Self-pay

## 2017-01-16 DIAGNOSIS — Z79899 Other long term (current) drug therapy: Secondary | ICD-10-CM | POA: Insufficient documentation

## 2017-01-16 DIAGNOSIS — R51 Headache: Secondary | ICD-10-CM | POA: Insufficient documentation

## 2017-01-16 DIAGNOSIS — Z23 Encounter for immunization: Secondary | ICD-10-CM | POA: Insufficient documentation

## 2017-01-16 DIAGNOSIS — S032XXA Dislocation of tooth, initial encounter: Secondary | ICD-10-CM | POA: Insufficient documentation

## 2017-01-16 DIAGNOSIS — Y999 Unspecified external cause status: Secondary | ICD-10-CM | POA: Insufficient documentation

## 2017-01-16 DIAGNOSIS — S0083XA Contusion of other part of head, initial encounter: Secondary | ICD-10-CM | POA: Insufficient documentation

## 2017-01-16 DIAGNOSIS — E349 Endocrine disorder, unspecified: Secondary | ICD-10-CM

## 2017-01-16 DIAGNOSIS — O0281 Inappropriate change in quantitative human chorionic gonadotropin (hCG) in early pregnancy: Secondary | ICD-10-CM | POA: Insufficient documentation

## 2017-01-16 DIAGNOSIS — Y9289 Other specified places as the place of occurrence of the external cause: Secondary | ICD-10-CM | POA: Insufficient documentation

## 2017-01-16 DIAGNOSIS — Y939 Activity, unspecified: Secondary | ICD-10-CM | POA: Insufficient documentation

## 2017-01-16 DIAGNOSIS — Z3201 Encounter for pregnancy test, result positive: Secondary | ICD-10-CM | POA: Insufficient documentation

## 2017-01-16 LAB — POC URINE PREG, ED: PREG TEST UR: POSITIVE — AB

## 2017-01-16 LAB — HCG, QUANTITATIVE, PREGNANCY: HCG, BETA CHAIN, QUANT, S: 567 m[IU]/mL — AB (ref <5)

## 2017-01-16 MED ORDER — AMOXICILLIN-POT CLAVULANATE 875-125 MG PO TABS
1.0000 | ORAL_TABLET | Freq: Once | ORAL | Status: AC
  Administered 2017-01-16: 1 via ORAL
  Filled 2017-01-16: qty 1

## 2017-01-16 MED ORDER — TETANUS-DIPHTH-ACELL PERTUSSIS 5-2.5-18.5 LF-MCG/0.5 IM SUSP
0.5000 mL | Freq: Once | INTRAMUSCULAR | Status: AC
  Administered 2017-01-16: 0.5 mL via INTRAMUSCULAR
  Filled 2017-01-16: qty 0.5

## 2017-01-16 MED ORDER — ACETAMINOPHEN 325 MG PO TABS
650.0000 mg | ORAL_TABLET | Freq: Once | ORAL | Status: AC
  Administered 2017-01-16: 650 mg via ORAL
  Filled 2017-01-16: qty 2

## 2017-01-16 MED ORDER — AMOXICILLIN-POT CLAVULANATE 875-125 MG PO TABS: 1.0000 | ORAL_TABLET | Freq: Two times a day (BID) | ORAL | 0 refills | Status: DC
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
## 2017-01-16 NOTE — ED Notes (Signed)
Patient transported to CT 

## 2017-01-16 NOTE — Discharge Instructions (Signed)
Please keep your appointment with Dr. Tonia BroomsIgnelzi tomorrow, June 1, at 8 AM. His office information is listed below. Please make sure to take the disc of your CT and the print out to your appointment. Please fill your Augmentin prescription and take the second dose tomorrow morning. You can take Tylenol for pain control. Your first dose was given in the Emergency Department. You can call [facility] to follow up with the positive pregnancy test in the Emergency Department.

## 2017-02-28 ENCOUNTER — Inpatient Hospital Stay (HOSPITAL_COMMUNITY): Payer: Medicaid Other

## 2017-02-28 ENCOUNTER — Inpatient Hospital Stay (HOSPITAL_COMMUNITY)
Admission: AD | Admit: 2017-02-28 | Discharge: 2017-02-28 | Disposition: A | Discharge status: Home / Self Care | Payer: Medicaid Other | Source: Ambulatory Visit | Attending: Obstetrics & Gynecology | Admitting: Obstetrics & Gynecology

## 2017-02-28 ENCOUNTER — Encounter (HOSPITAL_COMMUNITY): Payer: Self-pay

## 2017-02-28 DIAGNOSIS — R1031 Right lower quadrant pain: Secondary | ICD-10-CM | POA: Diagnosis present

## 2017-02-28 DIAGNOSIS — O26891 Other specified pregnancy related conditions, first trimester: Secondary | ICD-10-CM | POA: Diagnosis not present

## 2017-02-28 DIAGNOSIS — Z3A10 10 weeks gestation of pregnancy: Secondary | ICD-10-CM | POA: Insufficient documentation

## 2017-02-28 DIAGNOSIS — Z3491 Encounter for supervision of normal pregnancy, unspecified, first trimester: Secondary | ICD-10-CM

## 2017-02-28 DIAGNOSIS — O210 Mild hyperemesis gravidarum: Secondary | ICD-10-CM | POA: Insufficient documentation

## 2017-02-28 DIAGNOSIS — O219 Vomiting of pregnancy, unspecified: Secondary | ICD-10-CM

## 2017-02-28 DIAGNOSIS — R109 Unspecified abdominal pain: Secondary | ICD-10-CM | POA: Diagnosis not present

## 2017-02-28 DIAGNOSIS — O26899 Other specified pregnancy related conditions, unspecified trimester: Secondary | ICD-10-CM

## 2017-02-28 LAB — WET PREP, GENITAL
Clue Cells Wet Prep HPF POC: NONE SEEN
SPERM: NONE SEEN
Trich, Wet Prep: NONE SEEN
Yeast Wet Prep HPF POC: NONE SEEN

## 2017-02-28 LAB — URINALYSIS, ROUTINE W REFLEX MICROSCOPIC
BILIRUBIN URINE: NEGATIVE
Bacteria, UA: NONE SEEN
Glucose, UA: NEGATIVE mg/dL
Hgb urine dipstick: NEGATIVE
KETONES UR: 80 mg/dL — AB
Nitrite: NEGATIVE
PH: 6 (ref 5.0–8.0)
Protein, ur: 30 mg/dL — AB
SPECIFIC GRAVITY, URINE: 1.027 (ref 1.005–1.030)

## 2017-02-28 LAB — CBC
HEMATOCRIT: 34.6 % — AB (ref 36.0–46.0)
Hemoglobin: 12.1 g/dL (ref 12.0–15.0)
MCH: 29.8 pg (ref 26.0–34.0)
MCHC: 35 g/dL (ref 30.0–36.0)
MCV: 85.2 fL (ref 78.0–100.0)
Platelets: 220 10*3/uL (ref 150–400)
RBC: 4.06 MIL/uL (ref 3.87–5.11)
RDW: 12.7 % (ref 11.5–15.5)
WBC: 4.3 10*3/uL (ref 4.0–10.5)

## 2017-02-28 LAB — POCT PREGNANCY, URINE
PREG TEST UR: POSITIVE — AB
Preg Test, Ur: POSITIVE — AB

## 2017-02-28 LAB — HCG, QUANTITATIVE, PREGNANCY: HCG, BETA CHAIN, QUANT, S: 157973 m[IU]/mL — AB (ref <5)

## 2017-02-28 LAB — ABO/RH: ABO/RH(D): A POS

## 2017-02-28 MED ORDER — PROMETHAZINE HCL 12.5 MG PO TABS: 12.5000 mg | ORAL_TABLET | Freq: Four times a day (QID) | ORAL | 0 refills | Status: DC | PRN

## 2017-02-28 MED ORDER — PROMETHAZINE HCL 25 MG PO TABS
12.5000 mg | ORAL_TABLET | Freq: Once | ORAL | Status: AC
  Administered 2017-02-28: 12.5 mg via ORAL
  Filled 2017-02-28: qty 1

## 2017-02-28 MED ORDER — PRENATAL VITAMIN 27-0.8 MG PO TABS: 1.0000 | ORAL_TABLET | Freq: Every day | ORAL | 5 refills | Status: DC

## 2017-02-28 NOTE — MAU Note (Signed)
Pt presents to MAU with complaints of lower abdominal pain x 3 days, has had migraines intermittently x 2 days and missed period. First day of last menstrual cycle 10/20/2016. Pt denies vaginal discharge.

## 2017-02-28 NOTE — MAU Provider Note (Signed)
Chief Complaint: Abdominal Pain (missed period)   SUBJECTIVE HPI: [name] Schaefer is a 21 y.o. [id number] at [redacted]w[redacted]d who presents to [facility]missions reporting nausea, headache, RLQ/pelvic pain.  Patient states that she has been having one week of abdominal cramping with intermittent severe abdominal pain, localizing to the right lower pelvic region. She denies any bleeding. She did not know that she was pregnant. She has been having unprotected sex, attempted birth control pills but was not able to remember to be taking them. She denies any unusual vaginal discharge. She states that she has been extremely nauseous for 1 week, unable to eat food and barely able to tolerate liquids. She has had a headache for 2 days. She has a history of 3 prior TAB's, and one vaginal birth. She is not her any medication for her pain or nausea. She denies any fevers or chills. She has been regular with her periods every month, stating her last menstrual period was 12/20/16.     Past Medical History:  Diagnosis Date  . Anxiety   . Depression    OB History  Gravida Para Term Preterm AB Living  5 1 1   3 1   SAB TAB Ectopic Multiple Live Births  1 2   0 1    # Outcome Date GA Lbr Len/2nd Weight Sex Delivery Anes PTL Lv  5 Current           4 Term 11/27/14 [redacted]w[redacted]d 09:17 / 00:30 6 lb 6.8 oz (2.914 kg) M Vag-Spont EPI  LIV     Birth Comments: WNL  3 TAB 07/2013          2 SAB           1 TAB              Past Surgical History:  Procedure Laterality Date  . INDUCED ABORTION     Social History   Social History  . Marital status: Single    Spouse name: N/A  . Number of children: N/A  . Years of education: N/A   Occupational History  . Not on file.   Social History Main Topics  . Smoking status: Never Smoker  . Smokeless tobacco: Never Used  . Alcohol use No  . Drug use: No     Comment: Reports not drinking or using any drugs  . Sexual activity: Yes    Birth control/ protection: None   Other  Topics Concern  . Not on file   Social History Narrative  . No narrative on file   No current facility-administered medications on file prior to encounter.    Current Outpatient Prescriptions on File Prior to Encounter  Medication Sig Dispense Refill  . amoxicillin-clavulanate (AUGMENTIN) 875-125 MG tablet Take 1 tablet by mouth every 12 (twelve) hours. 20 tablet 0  . terconazole (TERAZOL 7) 0.4 % vaginal cream Place 1 applicator vaginally at bedtime. 45 g 0   No Known Allergies  I have reviewed the past Medical Hx, Surgical Hx, Social Hx, Allergies and Medications.   REVIEW OF SYSTEMS  A comprehensive ROS was negative except per HPI.   OBJECTIVE Patient Vitals for the past 24 hrs:  BP Temp Temp src Pulse Resp  02/28/17 1239 108/75 98.3 F (36.8 C) Oral 93 18    PHYSICAL EXAM Constitutional: Well-developed, well-nourished female in no acute distress.  Cardiovascular: normal rate Respiratory: normal rate and effort.  GI: Abd soft, non-tender, non-distended. Pos BS x 4 MS: Extremities nontender, no  edema, normal ROM Neurologic: Alert and oriented x 4.  GU: Neg CVAT. SPECULUM EXAM: NEFG, physiologic discharge, no blood noted, cervix clean BIMANUAL: cervix closed; uterus normal size for gestational age, no adnexal tenderness or masses. No CMT.  LAB RESULTS Results for orders placed or performed during the hospital encounter of 02/28/17 (from the past 24 hour(s))  Pregnancy, urine POC     Status: Abnormal   Collection Time: 02/28/17 12:32 PM  Result Value Ref Range   Preg Test, Ur POSITIVE (A) NEGATIVE  Pregnancy, urine POC     Status: Abnormal   Collection Time: 02/28/17 12:46 PM  Result Value Ref Range   Preg Test, Ur POSITIVE (A) NEGATIVE    IMAGING [place] Ob Comp Less 14 Wks  Result Date: 02/28/2017 CLINICAL DATA:  Abdominal pain in first-trimester pregnancy EXAM: OBSTETRIC <14 WK ULTRASOUND TECHNIQUE: Transabdominal ultrasound was performed for evaluation of the  gestation as well as the maternal uterus and adnexal regions. COMPARISON:  05/09/2016 FINDINGS: Intrauterine gestational sac: Single Yolk sac:  Visualized. Embryo:  Visualized. Cardiac Activity: Visualized. Heart Rate: 164 bpm CRL:   38.1  mm   10 w 5 d                  US EDC: 09/21/2017 Subchorionic hemorrhage:  None visualized. Maternal uterus/adnexae: Symmetric normal appearance of the ovaries. IMPRESSION: 1. Single living intrauterine pregnancy measuring 10 weeks 5 days. 2. No adverse finding. Electronically Signed   By: Marnee SpringJonathon  Watts M.D.   On: 02/28/2017 14:56    MAU COURSE Orders Placed This Encounter  Procedures  . Wet prep, genital  . US OB Comp Less 14 Wks  . Urinalysis, Routine w reflex microscopic  . CBC  . hCG, quantitative, pregnancy  . Pregnancy, urine POC  . Pregnancy, urine POC  . ABO/Rh  . Discharge patient     MDM Plan of care reviewed with patient, including labs and tests ordered and medical treatment. Patient able to tolerate PO diet before discharge. Home with anti-emetics. Return precautions given.    ASSESSMENT 1. Normal intrauterine pregnancy on prenatal ultrasound in first trimester   2. Abdominal pain during pregnancy in first trimester   3. Abdominal cramping affecting pregnancy   4. Nausea and vomiting during pregnancy     PLAN Discharge home in stable condition. Rx for phenergan   Allergies as of 02/28/2017   No Known Allergies     Medication List    STOP taking these medications   amoxicillin-clavulanate 875-125 MG tablet Commonly known as:  AUGMENTIN   terconazole 0.4 % vaginal cream Commonly known as:  TERAZOL 7     TAKE these medications   Prenatal Vitamin 27-0.8 MG Tabs Take 1 tablet by mouth daily.   promethazine 12.5 MG tablet Commonly known as:  PHENERGAN Take 1 tablet (12.5 mg total) by mouth every 6 (six) hours as needed for nausea or vomiting. Take a second tablet if still nauseous.        [name] MowElizabeth Rachell Druckenmiller, DO OB  Fellow 02/28/2017 1:16 PM

## 2017-02-28 NOTE — MAU Note (Signed)
Error with previous note, would not allow an addendum. Pt. States last menstrual cycle was 12/20/2016.

## 2017-02-28 NOTE — Discharge Instructions (Signed)

## 2017-03-03 LAB — GC/CHLAMYDIA PROBE AMP (~~LOC~~) NOT AT ARMC
CHLAMYDIA, DNA PROBE: NEGATIVE
Neisseria Gonorrhea: NEGATIVE

## 2017-05-20 ENCOUNTER — Encounter: Payer: Self-pay | Admitting: Obstetrics & Gynecology

## 2017-05-20 ENCOUNTER — Other Ambulatory Visit (HOSPITAL_COMMUNITY)
Admission: RE | Admit: 2017-05-20 | Discharge: 2017-05-20 | Disposition: A | Discharge status: Home / Self Care | Payer: Medicaid Other | Source: Ambulatory Visit | Attending: Obstetrics & Gynecology | Admitting: Obstetrics & Gynecology

## 2017-05-20 ENCOUNTER — Ambulatory Visit (INDEPENDENT_AMBULATORY_CARE_PROVIDER_SITE_OTHER): Payer: Medicaid Other | Admitting: Obstetrics & Gynecology

## 2017-05-20 DIAGNOSIS — O0992 Supervision of high risk pregnancy, unspecified, second trimester: Secondary | ICD-10-CM

## 2017-05-20 DIAGNOSIS — O0990 Supervision of high risk pregnancy, unspecified, unspecified trimester: Secondary | ICD-10-CM | POA: Diagnosis present

## 2017-05-20 DIAGNOSIS — Z3482 Encounter for supervision of other normal pregnancy, second trimester: Secondary | ICD-10-CM

## 2017-05-20 NOTE — Patient Instructions (Signed)

## 2017-05-20 NOTE — Progress Notes (Signed)
  Subjective:    [name] Schaefer is a [id number] [redacted]w[redacted]d being seen today for her first obstetrical visit.  Her obstetrical history is significant for late to care, with history of short cervix last pregnancy. Patient is undecided about intention to breast feed. Pregnancy history fully reviewed.  Patient reports no complaints.  Vitals:   05/20/17 1451 05/20/17 1453  BP: 111/72   Pulse: 82   Weight: 127 lb (57.6 kg)   Height:   (1.549 m)    HISTORY: OB History  Gravida Para Term Preterm AB Living  SAB TAB Ectopic Multiple Live Births  1 2   0 1    # Outcome Date GA Lbr Len/2nd Weight Sex Delivery Anes PTL Lv  5 Current           4 Term 11/27/14 [redacted]w[redacted]d 09:17 / 00:30 6 lb 6.8 oz (2.914 kg) M Vag-Spont EPI  LIV     Birth Comments: WNL  3 TAB 07/2013          2 SAB           1 TAB              Past Medical History:  Diagnosis Date  . Anxiety   . Depression    Past Surgical History:  Procedure Laterality Date  . INDUCED ABORTION     Family History  Problem Relation Age of Onset  . Hypertension Father   . Diabetes Father   . Diabetes Paternal Grandfather   . Stroke Paternal Grandfather   . Hypertension Paternal Grandfather   . Diabetes Maternal Grandmother   . Hypertension Maternal Grandmother   . Diabetes Maternal Grandfather   . Hypertension Maternal Grandfather   . Diabetes Paternal Grandmother   . Hypertension Paternal Grandmother      Exam    Uterus:  Fundal Height: 21 cm  Pelvic Exam:    Perineum: No Hemorrhoids   Vulva: normal   Vagina:  normal mucosa   pH:    Cervix: no lesions   Adnexa: no mass, fullness, tenderness   Bony Pelvis: average  System: Breast:  normal appearance, no masses or tenderness   Skin: normal coloration and turgor, no rashes    Neurologic: oriented, normal mood   Extremities: normal strength, tone, and muscle mass   HEENT PERRLA, extra ocular movement intact, sclera clear, anicteric, oropharynx clear, no  lesions and thyroid without masses   Mouth/Teeth mucous membranes moist, pharynx normal without lesions and dental hygiene good   Neck supple and no masses   Cardiovascular: regular rate and rhythm   Respiratory:  appears well, vitals normal, no respiratory distress, acyanotic, normal RR, ear and throat exam is normal, neck free of mass or lymphadenopathy   Abdomen: soft, non-tender; bowel sounds normal; no masses,  no organomegaly   Urinary: urethral meatus normal      Assessment:    Pregnancy: [id number] Patient Active Problem List   Diagnosis Date Noted  . Supervision of high risk pregnancy, antepartum 05/20/2017        Plan:     Initial labs drawn. Prenatal vitamins. Problem list reviewed and updated. Genetic Screening discussed Quad Screen: ordered.  Ultrasound discussed; fetal survey: ordered.  Follow up in 4 weeks. 50% of 30 min visit spent on counseling and coordination of care.     Scheryl Darter 05/20/2017

## 2017-05-20 NOTE — Progress Notes (Signed)
Pt c/o cramping lower left and right abdomen

## 2017-05-21 LAB — CERVICOVAGINAL ANCILLARY ONLY
CHLAMYDIA, DNA PROBE: NEGATIVE
NEISSERIA GONORRHEA: NEGATIVE

## 2017-05-22 LAB — AFP TETRA
DIA Mom Value: 0.9
DIA Value (EIA): 240.48 pg/mL
DSR (By Age)    1 IN: 1156
DSR (SECOND TRIMESTER) 1 IN: 10000
GESTATIONAL AGE AFP: 21.4 wk
MSAFP Mom: 0.91
MSAFP: 75.6 ng/mL
MSHCG Mom: 0.32
MSHCG: 8270 m[IU]/mL
Maternal Age At EDD: 20.5 yr
OSB RISK: 10000
T18 (By Age): 1:4505 {titer}
TEST RESULTS AFP: NEGATIVE
UE3 MOM: 0.78
WEIGHT: 127 [lb_av]
uE3 Value: 1.79 ng/mL

## 2017-05-22 LAB — OBSTETRIC PANEL, INCLUDING HIV
ANTIBODY SCREEN: NEGATIVE
BASOS: 0 %
Basophils Absolute: 0 10*3/uL (ref 0.0–0.2)
EOS (ABSOLUTE): 0.1 10*3/uL (ref 0.0–0.4)
EOS: 1 %
HEMOGLOBIN: 12 g/dL (ref 11.1–15.9)
HEP B S AG: NEGATIVE
HIV SCREEN 4TH GENERATION: NONREACTIVE
Hematocrit: 35.4 % (ref 34.0–46.6)
IMMATURE GRANS (ABS): 0 10*3/uL (ref 0.0–0.1)
Immature Granulocytes: 1 %
LYMPHS ABS: 1.6 10*3/uL (ref 0.7–3.1)
Lymphs: 28 %
MCH: 29.4 pg (ref 26.6–33.0)
MCHC: 33.9 g/dL (ref 31.5–35.7)
MCV: 87 fL (ref 79–97)
MONOCYTES: 7 %
MONOS ABS: 0.4 10*3/uL (ref 0.1–0.9)
NEUTROS ABS: 3.5 10*3/uL (ref 1.4–7.0)
NEUTROS PCT: 63 %
PLATELETS: 206 10*3/uL (ref 150–379)
RBC: 4.08 x10E6/uL (ref 3.77–5.28)
RDW: 13.9 % (ref 12.3–15.4)
RH TYPE: POSITIVE
RPR: NONREACTIVE
Rubella Antibodies, IGG: 10.7 index (ref >0.99)
WBC: 5.5 10*3/uL (ref 3.4–10.8)

## 2017-05-22 LAB — CULTURE, OB URINE

## 2017-05-22 LAB — HEMOGLOBINOPATHY EVALUATION
HGB C: 0 %
HGB S: 0 %
HGB VARIANT: 0 %
Hemoglobin A2 Quantitation: 2.4 % (ref 1.8–3.2)
Hemoglobin F Quantitation: 0 % (ref 0.0–2.0)
Hgb A: 97.6 % (ref 96.4–98.8)

## 2017-05-22 LAB — URINE CULTURE, OB REFLEX

## 2017-05-27 ENCOUNTER — Ambulatory Visit (HOSPITAL_COMMUNITY)
Admission: RE | Admit: 2017-05-27 | Discharge: 2017-05-27 | Disposition: A | Discharge status: Home / Self Care | Payer: Medicaid Other | Source: Ambulatory Visit | Attending: Obstetrics & Gynecology | Admitting: Obstetrics & Gynecology

## 2017-05-27 ENCOUNTER — Encounter (HOSPITAL_COMMUNITY): Payer: Self-pay

## 2017-05-27 ENCOUNTER — Other Ambulatory Visit: Payer: Self-pay | Admitting: Obstetrics & Gynecology

## 2017-05-27 DIAGNOSIS — I498 Other specified cardiac arrhythmias: Secondary | ICD-10-CM

## 2017-05-27 DIAGNOSIS — O0990 Supervision of high risk pregnancy, unspecified, unspecified trimester: Secondary | ICD-10-CM

## 2017-05-27 DIAGNOSIS — O09292 Supervision of pregnancy with other poor reproductive or obstetric history, second trimester: Secondary | ICD-10-CM

## 2017-05-27 DIAGNOSIS — Z3689 Encounter for other specified antenatal screening: Secondary | ICD-10-CM | POA: Diagnosis not present

## 2017-05-27 DIAGNOSIS — Z3A22 22 weeks gestation of pregnancy: Secondary | ICD-10-CM | POA: Insufficient documentation

## 2017-05-28 ENCOUNTER — Other Ambulatory Visit: Payer: Self-pay | Admitting: Obstetrics & Gynecology

## 2017-05-28 ENCOUNTER — Other Ambulatory Visit (HOSPITAL_COMMUNITY): Payer: Self-pay | Admitting: *Deleted

## 2017-05-28 DIAGNOSIS — O0990 Supervision of high risk pregnancy, unspecified, unspecified trimester: Secondary | ICD-10-CM

## 2017-05-28 DIAGNOSIS — O09292 Supervision of pregnancy with other poor reproductive or obstetric history, second trimester: Secondary | ICD-10-CM

## 2017-05-28 DIAGNOSIS — O26879 Cervical shortening, unspecified trimester: Secondary | ICD-10-CM

## 2017-05-28 DIAGNOSIS — I498 Other specified cardiac arrhythmias: Secondary | ICD-10-CM

## 2017-06-10 ENCOUNTER — Encounter (HOSPITAL_COMMUNITY): Payer: Self-pay

## 2017-06-10 ENCOUNTER — Ambulatory Visit (HOSPITAL_COMMUNITY)
Admission: RE | Admit: 2017-06-10 | Discharge: 2017-06-10 | Disposition: A | Discharge status: Home / Self Care | Payer: Medicaid Other | Source: Ambulatory Visit | Attending: Obstetrics & Gynecology | Admitting: Obstetrics & Gynecology

## 2017-06-17 ENCOUNTER — Ambulatory Visit (INDEPENDENT_AMBULATORY_CARE_PROVIDER_SITE_OTHER): Payer: Medicaid Other | Admitting: Obstetrics and Gynecology

## 2017-06-17 DIAGNOSIS — O0992 Supervision of high risk pregnancy, unspecified, second trimester: Secondary | ICD-10-CM

## 2017-06-17 DIAGNOSIS — O26872 Cervical shortening, second trimester: Secondary | ICD-10-CM

## 2017-06-17 DIAGNOSIS — O0990 Supervision of high risk pregnancy, unspecified, unspecified trimester: Secondary | ICD-10-CM

## 2017-06-17 DIAGNOSIS — O26879 Cervical shortening, unspecified trimester: Secondary | ICD-10-CM | POA: Insufficient documentation

## 2017-06-17 NOTE — Progress Notes (Signed)
Subjective:  [name] Schaefer is a 21 y.o. [id number] at 786w4d being seen today for ongoing prenatal care.  She is currently monitored for the following issues for this high-risk pregnancy and has Supervision of high risk pregnancy, antepartum and Cervical shortening on her problem list.  Patient reports no complaints.  Contractions: Not present. Vag. Bleeding: None.  Movement: Present. Denies leaking of fluid.   The following portions of the patient's history were reviewed and updated as appropriate: allergies, current medications, past family history, past medical history, past social history, past surgical history and problem list. Problem list updated.  Objective:   Vitals:   06/17/17 1103  BP: 113/67  Pulse: 91  Weight: 132 lb (59.9 kg)    Fetal Status: Fetal Heart Rate (bpm): 142   Movement: Present     General:  Alert, oriented and cooperative. Patient is in no acute distress.  Skin: Skin is warm and dry. No rash noted.   Cardiovascular: Normal heart rate noted  Respiratory: Normal respiratory effort, no problems with respiration noted  Abdomen: Soft, gravid, appropriate for gestational age. Pain/Pressure: Present     Pelvic:  Cervical exam deferred        Extremities: Normal range of motion.  Edema: None  Mental Status: Normal mood and affect. Normal behavior. Normal judgment and thought content.   Urinalysis:      Assessment and Plan:  Pregnancy: G5P1031 at 5486w4d  1. Supervision of high risk pregnancy, antepartum Stable Declined flu vaccine  2. Cervical shortening in second trimester F/U U/S for CL this week  Preterm labor symptoms and general obstetric precautions including but not limited to vaginal bleeding, contractions, leaking of fluid and fetal movement were reviewed in detail with the patient. Please refer to After Visit Summary for other counseling recommendations.  Return in about 3 weeks (around 07/08/2017) for OB visit.   Hermina StaggersErvin, Michael L, MD

## 2017-06-19 ENCOUNTER — Ambulatory Visit (HOSPITAL_COMMUNITY)
Admission: RE | Admit: 2017-06-19 | Discharge: 2017-06-19 | Disposition: A | Discharge status: Home / Self Care | Payer: Medicaid Other | Source: Ambulatory Visit | Attending: Maternal and Fetal Medicine | Admitting: Maternal and Fetal Medicine

## 2017-06-26 ENCOUNTER — Encounter (HOSPITAL_COMMUNITY): Payer: Self-pay

## 2017-06-26 ENCOUNTER — Ambulatory Visit (HOSPITAL_COMMUNITY)
Admission: RE | Admit: 2017-06-26 | Discharge: 2017-06-26 | Disposition: A | Discharge status: Home / Self Care | Payer: Medicaid Other | Source: Ambulatory Visit | Attending: Obstetrics & Gynecology | Admitting: Obstetrics & Gynecology

## 2017-06-26 ENCOUNTER — Other Ambulatory Visit (HOSPITAL_COMMUNITY): Payer: Self-pay | Admitting: Maternal and Fetal Medicine

## 2017-06-26 DIAGNOSIS — Z3686 Encounter for antenatal screening for cervical length: Secondary | ICD-10-CM | POA: Diagnosis not present

## 2017-06-26 DIAGNOSIS — Z3A26 26 weeks gestation of pregnancy: Secondary | ICD-10-CM

## 2017-06-26 DIAGNOSIS — O0930 Supervision of pregnancy with insufficient antenatal care, unspecified trimester: Secondary | ICD-10-CM

## 2017-06-26 DIAGNOSIS — O26872 Cervical shortening, second trimester: Secondary | ICD-10-CM | POA: Insufficient documentation

## 2017-06-26 DIAGNOSIS — O0932 Supervision of pregnancy with insufficient antenatal care, second trimester: Secondary | ICD-10-CM | POA: Diagnosis not present

## 2017-06-26 DIAGNOSIS — O26879 Cervical shortening, unspecified trimester: Secondary | ICD-10-CM

## 2017-07-08 ENCOUNTER — Encounter: Payer: Self-pay | Admitting: Obstetrics and Gynecology

## 2017-07-08 ENCOUNTER — Encounter: Payer: Self-pay | Admitting: Obstetrics

## 2017-07-08 ENCOUNTER — Other Ambulatory Visit: Payer: Medicaid Other

## 2017-07-28 ENCOUNTER — Other Ambulatory Visit: Payer: Medicaid Other

## 2017-07-28 ENCOUNTER — Encounter: Payer: Self-pay | Admitting: Obstetrics

## 2017-08-01 ENCOUNTER — Other Ambulatory Visit: Payer: Medicaid Other

## 2017-08-01 ENCOUNTER — Encounter: Payer: Self-pay | Admitting: Obstetrics

## 2017-08-01 ENCOUNTER — Ambulatory Visit (INDEPENDENT_AMBULATORY_CARE_PROVIDER_SITE_OTHER): Payer: Medicaid Other | Admitting: Obstetrics

## 2017-08-01 VITALS — BP 113/72 | HR 92 | Wt 136.8 lb

## 2017-08-01 DIAGNOSIS — G8929 Other chronic pain: Secondary | ICD-10-CM

## 2017-08-01 DIAGNOSIS — O26879 Cervical shortening, unspecified trimester: Secondary | ICD-10-CM

## 2017-08-01 DIAGNOSIS — O26873 Cervical shortening, third trimester: Secondary | ICD-10-CM

## 2017-08-01 DIAGNOSIS — M545 Low back pain, unspecified: Secondary | ICD-10-CM

## 2017-08-01 DIAGNOSIS — O0993 Supervision of high risk pregnancy, unspecified, third trimester: Secondary | ICD-10-CM

## 2017-08-01 DIAGNOSIS — O0990 Supervision of high risk pregnancy, unspecified, unspecified trimester: Secondary | ICD-10-CM

## 2017-08-01 MED ORDER — COMFORT FIT MATERNITY SUPP SM MISC: 0 refills | Status: DC

## 2017-08-01 NOTE — Progress Notes (Signed)
Pt denies concerns at this time. 

## 2017-08-01 NOTE — Progress Notes (Signed)
Subjective:  [name] Schaefer is a 21 y.o. U1L2440G5P1031 at 3649w0d being seen today for ongoing prenatal care.  She is currently monitored for the following issues for this high-risk pregnancy and has Supervision of high risk pregnancy, antepartum and Cervical shortening on their problem list.  Patient reports backache.  Contractions: Not present. Vag. Bleeding: None.  Movement: Present. Denies leaking of fluid.   The following portions of the patient's history were reviewed and updated as appropriate: allergies, current medications, past family history, past medical history, past social history, past surgical history and problem list. Problem list updated.  Objective:   Vitals:   08/01/17 0914  BP: 113/72  Pulse: 92  Weight: 136 lb 12.8 oz (62.1 kg)    Fetal Status: Fetal Heart Rate (bpm): 140   Movement: Present     General:  Alert, oriented and cooperative. Patient is in no acute distress.  Skin: Skin is warm and dry. No rash noted.   Cardiovascular: Normal heart rate noted  Respiratory: Normal respiratory effort, no problems with respiration noted  Abdomen: Soft, gravid, appropriate for gestational age. Pain/Pressure: Absent     Pelvic:  Cervical exam deferred        Extremities: Normal range of motion.  Edema: None  Mental Status: Normal mood and affect. Normal behavior. Normal judgment and thought content.   Urinalysis:      Assessment and Plan:  Pregnancy: G5P1031 at 9349w0d  1. Supervision of high risk pregnancy, antepartum - stable.  No uterine activity.  2. Antepartum cervical shortening Rx: - US MFM OB FOLLOW UP; Future - cervical length  3. Chronic midline low back pain without sciatica Rx: - Elastic Bandages & Supports (COMFORT FIT MATERNITY SUPP SM) MISC; Wear as directed.  Dispense: 1 each; Refill: 0  Preterm labor symptoms and general obstetric precautions including but not limited to vaginal bleeding, contractions, leaking of fluid and fetal movement were reviewed  in detail with the patient. Please refer to After Visit Summary for other counseling recommendations.  Return in about 2 weeks (around 08/15/2017) for ROB.   Brock BadHarper, Charles A, MD

## 2017-08-01 NOTE — Addendum Note (Signed)
Addended by: Lear NgMARTIN, MISTY L on: 08/01/2017 10:08 AM   Modules accepted: Orders

## 2017-08-02 LAB — CBC
HEMOGLOBIN: 11.1 g/dL (ref 11.1–15.9)
Hematocrit: 32.9 % — ABNORMAL LOW (ref 34.0–46.6)
MCH: 28.8 pg (ref 26.6–33.0)
MCHC: 33.7 g/dL (ref 31.5–35.7)
MCV: 86 fL (ref 79–97)
Platelets: 169 10*3/uL (ref 150–379)
RBC: 3.85 x10E6/uL (ref 3.77–5.28)
RDW: 14 % (ref 12.3–15.4)
WBC: 6 10*3/uL (ref 3.4–10.8)

## 2017-08-02 LAB — GLUCOSE TOLERANCE, 2 HOURS W/ 1HR
Glucose, 1 hour: 127 mg/dL (ref 65–179)
Glucose, 2 hour: 112 mg/dL (ref 65–152)
Glucose, Fasting: 72 mg/dL (ref 65–91)

## 2017-08-02 LAB — RPR: RPR: NONREACTIVE

## 2017-08-02 LAB — HIV ANTIBODY (ROUTINE TESTING W REFLEX): HIV Screen 4th Generation wRfx: NONREACTIVE

## 2017-08-06 ENCOUNTER — Encounter: Payer: Self-pay | Admitting: Obstetrics

## 2017-08-11 ENCOUNTER — Ambulatory Visit (HOSPITAL_COMMUNITY)
Admission: RE | Admit: 2017-08-11 | Discharge: 2017-08-11 | Disposition: A | Discharge status: Home / Self Care | Payer: Medicaid Other | Source: Ambulatory Visit | Attending: Obstetrics | Admitting: Obstetrics

## 2017-08-15 ENCOUNTER — Ambulatory Visit (INDEPENDENT_AMBULATORY_CARE_PROVIDER_SITE_OTHER): Payer: Medicaid Other | Admitting: Obstetrics

## 2017-08-15 ENCOUNTER — Encounter: Payer: Self-pay | Admitting: Obstetrics

## 2017-08-15 VITALS — BP 109/70 | HR 99 | Wt 137.2 lb

## 2017-08-15 DIAGNOSIS — O0993 Supervision of high risk pregnancy, unspecified, third trimester: Secondary | ICD-10-CM

## 2017-08-15 DIAGNOSIS — O0990 Supervision of high risk pregnancy, unspecified, unspecified trimester: Secondary | ICD-10-CM

## 2017-08-15 DIAGNOSIS — O26879 Cervical shortening, unspecified trimester: Secondary | ICD-10-CM

## 2017-08-15 DIAGNOSIS — M545 Low back pain, unspecified: Secondary | ICD-10-CM

## 2017-08-15 DIAGNOSIS — O26873 Cervical shortening, third trimester: Secondary | ICD-10-CM

## 2017-08-15 NOTE — Progress Notes (Signed)
Patient reports good fetal movement, denies pain. 

## 2017-08-15 NOTE — Progress Notes (Signed)
Subjective:  Shaquia R Chucky MaySmyre is a 21 y.o. [id number] at 6840w0d being seen today for ongoing prenatal care.  She is currently monitored for the following issues for this high-risk pregnancy and has Supervision of high risk pregnancy, antepartum and Cervical shortening on their problem list.  Patient reports backache.  Contractions: Not present. Vag. Bleeding: None.  Movement: Present. Denies leaking of fluid.   The following portions of the patient's history were reviewed and updated as appropriate: allergies, current medications, past family history, past medical history, past social history, past surgical history and problem list. Problem list updated.  Objective:   Vitals:   08/15/17 1115  BP: 109/70  Pulse: 99  Weight: 137 lb 3.2 oz (62.2 kg)    Fetal Status: Fetal Heart Rate (bpm): 140   Movement: Present     General:  Alert, oriented and cooperative. Patient is in no acute distress.  Skin: Skin is warm and dry. No rash noted.   Cardiovascular: Normal heart rate noted  Respiratory: Normal respiratory effort, no problems with respiration noted  Abdomen: Soft, gravid, appropriate for gestational age. Pain/Pressure: Absent     Pelvic:  Cervical exam deferred        Extremities: Normal range of motion.  Edema: None  Mental Status: Normal mood and affect. Normal behavior. Normal judgment and thought content.   Urinalysis:      Assessment and Plan:  Pregnancy: G5P1031 at 4440w0d  1. Supervision of high risk pregnancy, antepartum  2. Antepartum cervical shortening - stable  3. Acute midline low back pain without sciatica - stable, with Maternity Belt  Preterm labor symptoms and general obstetric precautions including but not limited to vaginal bleeding, contractions, leaking of fluid and fetal movement were reviewed in detail with the patient. Please refer to After Visit Summary for other counseling recommendations.  Return in about 2 weeks (around 08/29/2017) for ROB.   Brock BadHarper,  Jeyren Danowski A, MD

## 2017-08-19 NOTE — L&D Delivery Note (Signed)
Delivery Note At 9:40 PM a viable female was delivered via Vaginal, Spontaneous (Presentation: vertex; ROA) over an intact perineum. Head delivered ROA. No nuchal cord present. Shoulder and body delivered in usual fashion. Infant placed on mother's abdomen, dried and bulb suctioned. Cord clamped x 2 after 1-minute delay, and cut by family member. Cord blood drawn. After 1 minute, the cord was clamped and cut. 40 units of pitocin diluted in 1000cc LR was infused rapidly IV.  The placenta separated spontaneously and delivered via CCT and maternal pushing effort.  It was inspected and appears to be intact with a 3 VC.     APGAR: 9, 9; weight pending.   Placenta status: intact, sent to L&D.  Cord: 3V without complications.  Cord pH: not sent  Anesthesia:epidural   Episiotomy: None Lacerations: None Suture Repair: n/a Est. Blood Loss (mL): 50  Mom to postpartum.  Baby to Couplet care / Skin to Skin.  Alroy Bailiffarker W Sanii Kukla 09/17/2017, 9:52 PM

## 2017-08-21 ENCOUNTER — Encounter (HOSPITAL_COMMUNITY): Payer: Self-pay

## 2017-08-21 ENCOUNTER — Ambulatory Visit (HOSPITAL_COMMUNITY)
Admission: RE | Admit: 2017-08-21 | Discharge: 2017-08-21 | Disposition: A | Discharge status: Home / Self Care | Payer: Medicaid Other | Source: Ambulatory Visit | Attending: Obstetrics | Admitting: Obstetrics

## 2017-08-21 DIAGNOSIS — O26879 Cervical shortening, unspecified trimester: Secondary | ICD-10-CM | POA: Diagnosis present

## 2017-08-21 DIAGNOSIS — Z3A34 34 weeks gestation of pregnancy: Secondary | ICD-10-CM | POA: Diagnosis not present

## 2017-08-21 DIAGNOSIS — O26873 Cervical shortening, third trimester: Secondary | ICD-10-CM | POA: Insufficient documentation

## 2017-08-29 ENCOUNTER — Encounter: Payer: Medicaid Other | Admitting: Obstetrics

## 2017-09-01 ENCOUNTER — Other Ambulatory Visit (HOSPITAL_COMMUNITY)
Admission: RE | Admit: 2017-09-01 | Discharge: 2017-09-01 | Disposition: A | Discharge status: Home / Self Care | Payer: Medicaid Other | Source: Ambulatory Visit | Attending: Obstetrics | Admitting: Obstetrics

## 2017-09-01 ENCOUNTER — Encounter: Payer: Self-pay | Admitting: Obstetrics

## 2017-09-01 ENCOUNTER — Ambulatory Visit (INDEPENDENT_AMBULATORY_CARE_PROVIDER_SITE_OTHER): Payer: Medicaid Other | Admitting: Obstetrics

## 2017-09-01 VITALS — BP 110/74 | HR 84 | Wt 140.8 lb

## 2017-09-01 DIAGNOSIS — Z3A36 36 weeks gestation of pregnancy: Secondary | ICD-10-CM | POA: Diagnosis not present

## 2017-09-01 DIAGNOSIS — O0993 Supervision of high risk pregnancy, unspecified, third trimester: Secondary | ICD-10-CM | POA: Diagnosis not present

## 2017-09-01 DIAGNOSIS — N898 Other specified noninflammatory disorders of vagina: Secondary | ICD-10-CM

## 2017-09-01 DIAGNOSIS — O0990 Supervision of high risk pregnancy, unspecified, unspecified trimester: Secondary | ICD-10-CM | POA: Diagnosis present

## 2017-09-01 DIAGNOSIS — O9989 Other specified diseases and conditions complicating pregnancy, childbirth and the puerperium: Secondary | ICD-10-CM

## 2017-09-01 NOTE — Progress Notes (Signed)
Subjective:  [name] Schaefer is a 22 y.o. [id number] at 5539w3d being seen today for ongoing prenatal care.  She is currently monitored for the following issues for this low-risk pregnancy and has Supervision of high risk pregnancy, antepartum and Cervical shortening on their problem list.  Patient reports no complaints.  Contractions: Irregular. Vag. Bleeding: None.  Movement: Present. Denies leaking of fluid.   The following portions of the patient's history were reviewed and updated as appropriate: allergies, current medications, past family history, past medical history, past social history, past surgical history and problem list. Problem list updated.  Objective:   Vitals:   09/01/17 0859  BP: 110/74  Pulse: 84  Weight: 140 lb 12.8 oz (63.9 kg)    Fetal Status: Fetal Heart Rate (bpm): 140   Movement: Present     General:  Alert, oriented and cooperative. Patient is in no acute distress.  Skin: Skin is warm and dry. No rash noted.   Cardiovascular: Normal heart rate noted  Respiratory: Normal respiratory effort, no problems with respiration noted  Abdomen: Soft, gravid, appropriate for gestational age. Pain/Pressure: Present     Pelvic:  Cervical exam deferred        Extremities: Normal range of motion.  Edema: None  Mental Status: Normal mood and affect. Normal behavior. Normal judgment and thought content.   Urinalysis:      Assessment and Plan:  Pregnancy: G5P1031 at 5939w3d  1. Supervision of high risk pregnancy, antepartum Rx: - Strep Gp B NAA - Cervicovaginal ancillary only  2. Vaginal discharge Rx: - Cervicovaginal ancillary only  Term labor symptoms and general obstetric precautions including but not limited to vaginal bleeding, contractions, leaking of fluid and fetal movement were reviewed in detail with the patient. Please refer to After Visit Summary for other counseling recommendations.  Return in about 1 week (around 09/08/2017) for ROB.   Brock BadHarper, [name],  MD

## 2017-09-01 NOTE — Progress Notes (Signed)
Patient reports good fetal movement with pressure in her back that comes and go.

## 2017-09-01 NOTE — Addendum Note (Signed)
Addended by: Coral CeoHARPER, Jonaven Hilgers A on: 09/01/2017 12:47 PM   Modules accepted: Kipp BroodSmartSet

## 2017-09-02 ENCOUNTER — Other Ambulatory Visit: Payer: Self-pay | Admitting: Obstetrics

## 2017-09-02 DIAGNOSIS — B3731 Acute candidiasis of vulva and vagina: Secondary | ICD-10-CM

## 2017-09-02 DIAGNOSIS — B373 Candidiasis of vulva and vagina: Secondary | ICD-10-CM

## 2017-09-02 LAB — CERVICOVAGINAL ANCILLARY ONLY
BACTERIAL VAGINITIS: NEGATIVE
CANDIDA VAGINITIS: POSITIVE — AB
CHLAMYDIA, DNA PROBE: NEGATIVE
NEISSERIA GONORRHEA: NEGATIVE
Trichomonas: NEGATIVE

## 2017-09-02 MED ORDER — FLUCONAZOLE 150 MG PO TABS: 150.0000 mg | ORAL_TABLET | Freq: Once | ORAL | 0 refills | Status: AC

## 2017-09-03 ENCOUNTER — Telehealth: Payer: Self-pay

## 2017-09-03 LAB — STREP GP B NAA: Strep Gp B NAA: POSITIVE — AB

## 2017-09-03 NOTE — Telephone Encounter (Signed)
  Left VM message to pick up RX. 

## 2017-09-03 NOTE — Telephone Encounter (Signed)
-----   Message from Brock Badharles A Harper, MD sent at 09/02/2017  5:56 PM EST ----- Diflucan Rx for yeast

## 2017-09-08 ENCOUNTER — Ambulatory Visit (INDEPENDENT_AMBULATORY_CARE_PROVIDER_SITE_OTHER): Payer: Medicaid Other | Admitting: Obstetrics

## 2017-09-08 ENCOUNTER — Encounter: Payer: Self-pay | Admitting: Obstetrics

## 2017-09-08 ENCOUNTER — Other Ambulatory Visit (HOSPITAL_COMMUNITY)
Admission: RE | Admit: 2017-09-08 | Discharge: 2017-09-08 | Disposition: A | Discharge status: Home / Self Care | Payer: Medicaid Other | Source: Ambulatory Visit | Attending: Obstetrics | Admitting: Obstetrics

## 2017-09-08 VITALS — BP 108/68 | HR 91 | Wt 138.0 lb

## 2017-09-08 DIAGNOSIS — O26893 Other specified pregnancy related conditions, third trimester: Secondary | ICD-10-CM | POA: Diagnosis not present

## 2017-09-08 DIAGNOSIS — O26879 Cervical shortening, unspecified trimester: Secondary | ICD-10-CM

## 2017-09-08 DIAGNOSIS — R300 Dysuria: Secondary | ICD-10-CM | POA: Diagnosis present

## 2017-09-08 DIAGNOSIS — N898 Other specified noninflammatory disorders of vagina: Secondary | ICD-10-CM | POA: Insufficient documentation

## 2017-09-08 DIAGNOSIS — O0990 Supervision of high risk pregnancy, unspecified, unspecified trimester: Secondary | ICD-10-CM

## 2017-09-08 LAB — POCT URINALYSIS DIPSTICK
Bilirubin, UA: NEGATIVE
GLUCOSE UA: NEGATIVE
KETONES UA: NEGATIVE
Nitrite, UA: NEGATIVE
Protein, UA: NEGATIVE
RBC UA: NEGATIVE
SPEC GRAV UA: 1.02 (ref 1.010–1.025)
Urobilinogen, UA: 0.2 E.U./dL
pH, UA: 6.5 (ref 5.0–8.0)

## 2017-09-08 MED ORDER — TERCONAZOLE 0.8 % VA CREA: 1.0000 | TOPICAL_CREAM | Freq: Every day | VAGINAL | 0 refills | Status: DC

## 2017-09-08 NOTE — Progress Notes (Signed)
Subjective:  [name] Schaefer is a 22 y.o. [id number] at 7477w3d being seen today for ongoing prenatal care.  She is currently monitored for the following issues for this high-risk pregnancy and has Supervision of high risk pregnancy, antepartum and Cervical shortening on their problem list.  Patient reports vaginal irritation and burning with urination.  Contractions: Irregular. Vag. Bleeding: None.  Movement: Present. Denies leaking of fluid.   The following portions of the patient's history were reviewed and updated as appropriate: allergies, current medications, past family history, past medical history, past social history, past surgical history and problem list. Problem list updated.  Objective:   Vitals:   09/08/17 0943  BP: 108/68  Pulse: 91  Weight: 138 lb (62.6 kg)    Fetal Status:     Movement: Present     General:  Alert, oriented and cooperative. Patient is in no acute distress.  Skin: Skin is warm and dry. No rash noted.   Cardiovascular: Normal heart rate noted  Respiratory: Normal respiratory effort, no problems with respiration noted  Abdomen: Soft, gravid, appropriate for gestational age. Pain/Pressure: Present     Pelvic:  Cervical exam performed      Cvx: 1-2 cm / Long / -3 / Vtx  Extremities: Normal range of motion.  Edema: None  Mental Status: Normal mood and affect. Normal behavior. Normal judgment and thought content.   Urinalysis:      Assessment and Plan:  Pregnancy: G5P1031 at 5677w3d  1. Supervision of high risk pregnancy, antepartum - doing well  2. Antepartum cervical shortening - stable on exam  3. Vaginal discharge Rx: - terconazole (TERAZOL 3) 0.8 % vaginal cream; Place 1 applicator vaginally at bedtime.  Dispense: 20 g; Refill: 0 - Cervicovaginal ancillary only  4. Dysuria during pregnancy in third trimester Rx: - POCT Urinalysis Dipstick:  Few leukocytes - urine culture sent  Term labor symptoms and general obstetric precautions including  but not limited to vaginal bleeding, contractions, leaking of fluid and fetal movement were reviewed in detail with the patient. Please refer to After Visit Summary for other counseling recommendations.  Return in about 1 week (around 09/15/2017) for ROB.   Brock BadHarper, Cissy Galbreath A, MD

## 2017-09-08 NOTE — Progress Notes (Signed)
Pt states she has been having a lor of contractions and back contractions unable to sleep.  Pt states she has vaginal itching and white thick discharge. Also notes dysuria w/ burning.  Pt requested Cx check states she had her son at 1838wks.

## 2017-09-09 LAB — CERVICOVAGINAL ANCILLARY ONLY
Bacterial vaginitis: NEGATIVE
CANDIDA VAGINITIS: NEGATIVE
Chlamydia: NEGATIVE
Neisseria Gonorrhea: NEGATIVE
TRICH (WINDOWPATH): NEGATIVE

## 2017-09-10 LAB — URINE CULTURE, OB REFLEX

## 2017-09-10 LAB — CULTURE, OB URINE

## 2017-09-13 ENCOUNTER — Encounter (HOSPITAL_COMMUNITY): Payer: Self-pay

## 2017-09-13 ENCOUNTER — Inpatient Hospital Stay (HOSPITAL_COMMUNITY)
Admission: AD | Admit: 2017-09-13 | Discharge: 2017-09-13 | Disposition: A | Discharge status: Home / Self Care | Payer: Medicaid Other | Source: Ambulatory Visit | Attending: Obstetrics & Gynecology | Admitting: Obstetrics & Gynecology

## 2017-09-13 DIAGNOSIS — Z3A38 38 weeks gestation of pregnancy: Secondary | ICD-10-CM | POA: Insufficient documentation

## 2017-09-13 DIAGNOSIS — O26893 Other specified pregnancy related conditions, third trimester: Secondary | ICD-10-CM | POA: Diagnosis not present

## 2017-09-13 DIAGNOSIS — O471 False labor at or after 37 completed weeks of gestation: Secondary | ICD-10-CM | POA: Diagnosis not present

## 2017-09-13 LAB — POCT FERN TEST: POCT Fern Test: NEGATIVE

## 2017-09-13 NOTE — MAU Note (Signed)
Reports LOF since 1600 on 09/12/17, clear fluid, still leaking today  Feeling some ctx  No vaginal bleeding, +FM

## 2017-09-13 NOTE — MAU Note (Signed)
Urine sent to lab 

## 2017-09-13 NOTE — MAU Provider Note (Signed)
S: Ms. [name] Schaefer is a 22 y.o. [phone] at 9169w1d  who presents to MAU today complaining of leaking of fluid since having sex last night. She denies vaginal bleeding. She denies contractions. She reports normal fetal movement.    O: BP 120/70 (BP Location: Right Arm)   Pulse 89   Temp 98 F (36.7 C) (Oral)   Resp 18   Ht 5\' 1"  (1.549 m)   Wt 141 lb 12 oz (64.3 kg)   LMP 12/20/2016 (Exact Date)   BMI 26.78 kg/m  GENERAL: Well-developed, well-nourished female in no acute distress.  HEAD: Normocephalic, atraumatic.  CHEST: Normal effort of breathing, regular heart rate ABDOMEN: Soft, nontender, gravid PELVIC: Normal external female genitalia. Vagina is pink and rugated. Cervix with normal contour, no lesions. Normal discharge.  no pooling.   Cervical exam:      Fetal Monitoring: Baseline: 130 Variability: mod Accelerations: present Decelerations: none Contractions: occasional  No results found for this or any previous visit (from the past 24 hour(s)).   A: SIUP at 469w1d  Membranes intact  Sterile spec exam no pooling Fern neg  P: d/c home Not in labor  Montez MoritaLawson, Siham Bucaro D, CNM 09/13/2017 7:13 PM

## 2017-09-13 NOTE — Discharge Instructions (Signed)
Braxton Hicks Contractions °Contractions of the uterus can occur throughout pregnancy, but they are not always a sign that you are in labor. You may have practice contractions called Braxton Hicks contractions. These false labor contractions are sometimes confused with true labor. °What are Braxton Hicks contractions? °Braxton Hicks contractions are tightening movements that occur in the muscles of the uterus before labor. Unlike true labor contractions, these contractions do not result in opening (dilation) and thinning of the cervix. Toward the end of pregnancy (32-34 weeks), Braxton Hicks contractions can happen more often and may become stronger. These contractions are sometimes difficult to tell apart from true labor because they can be very uncomfortable. You should not feel embarrassed if you go to the hospital with false labor. °Sometimes, the only way to tell if you are in true labor is for your health care provider to look for changes in the cervix. The health care provider will do a physical exam and may monitor your contractions. If you are not in true labor, the exam should show that your cervix is not dilating and your water has not broken. °If there are other health problems associated with your pregnancy, it is completely safe for you to be sent home with false labor. You may continue to have Braxton Hicks contractions until you go into true labor. °How to tell the difference between true labor and false labor °True labor °· Contractions last 30-70 seconds. °· Contractions become very regular. °· Discomfort is usually felt in the top of the uterus, and it spreads to the lower abdomen and low back. °· Contractions do not go away with walking. °· Contractions usually become more intense and increase in frequency. °· The cervix dilates and gets thinner. °False labor °· Contractions are usually shorter and not as strong as true labor contractions. °· Contractions are usually irregular. °· Contractions  are often felt in the front of the lower abdomen and in the groin. °· Contractions may go away when you walk around or change positions while lying down. °· Contractions get weaker and are shorter-lasting as time goes on. °· The cervix usually does not dilate or become thin. °Follow these instructions at home: °· Take over-the-counter and prescription medicines only as told by your health care provider. °· Keep up with your usual exercises and follow other instructions from your health care provider. °· Eat and drink lightly if you think you are going into labor. °· If Braxton Hicks contractions are making you uncomfortable: °? Change your position from lying down or resting to walking, or change from walking to resting. °? Sit and rest in a tub of warm water. °? Drink enough fluid to keep your urine pale yellow. Dehydration may cause these contractions. °? Do slow and deep breathing several times an hour. °· Keep all follow-up prenatal visits as told by your health care provider. This is important. °Contact a health care provider if: °· You have a fever. °· You have continuous pain in your abdomen. °Get help right away if: °· Your contractions become stronger, more regular, and closer together. °· You have fluid leaking or gushing from your vagina. °· You pass blood-tinged mucus (bloody show). °· You have bleeding from your vagina. °· You have low back pain that you never had before. °· You feel your baby’s head pushing down and causing pelvic pressure. °· Your baby is not moving inside you as much as it used to. °Summary °· Contractions that occur before labor are called Braxton   Hicks contractions, false labor, or practice contractions. °· Braxton Hicks contractions are usually shorter, weaker, farther apart, and less regular than true labor contractions. True labor contractions usually become progressively stronger and regular and they become more frequent. °· Manage discomfort from Braxton Hicks contractions by  changing position, resting in a warm bath, drinking plenty of water, or practicing deep breathing. °This information is not intended to replace advice given to you by your health care provider. Make sure you discuss any questions you have with your health care provider. °Document Released: 12/19/2016 Document Revised: 12/19/2016 Document Reviewed: 12/19/2016 °Elsevier Interactive Patient Education © 2018 [employer] ° °

## 2017-09-15 ENCOUNTER — Ambulatory Visit (INDEPENDENT_AMBULATORY_CARE_PROVIDER_SITE_OTHER): Payer: Medicaid Other | Admitting: Obstetrics

## 2017-09-15 ENCOUNTER — Encounter: Payer: Self-pay | Admitting: Obstetrics

## 2017-09-15 VITALS — BP 115/80 | HR 80 | Wt 141.0 lb

## 2017-09-15 DIAGNOSIS — O0993 Supervision of high risk pregnancy, unspecified, third trimester: Secondary | ICD-10-CM

## 2017-09-15 DIAGNOSIS — O26873 Cervical shortening, third trimester: Secondary | ICD-10-CM

## 2017-09-15 DIAGNOSIS — O26879 Cervical shortening, unspecified trimester: Secondary | ICD-10-CM

## 2017-09-15 DIAGNOSIS — O0990 Supervision of high risk pregnancy, unspecified, unspecified trimester: Secondary | ICD-10-CM

## 2017-09-15 NOTE — Progress Notes (Signed)
Subjective:  [name] Schaefer is a 22 y.o. [id number] at 4635w3d being seen today for ongoing prenatal care.  She is currently monitored for the following issues for this high-risk pregnancy and has Supervision of high risk pregnancy, antepartum and Cervical shortening on their problem list.  Patient reports occasional contractions.  Contractions: Irregular. Vag. Bleeding: None.  Movement: Present. Denies leaking of fluid.   The following portions of the patient's history were reviewed and updated as appropriate: allergies, current medications, past family history, past medical history, past social history, past surgical history and problem list. Problem list updated.  Objective:   Vitals:   09/15/17 1425  BP: 115/80  Pulse: 80  Weight: 141 lb (64 kg)    Fetal Status:     Movement: Present     General:  Alert, oriented and cooperative. Patient is in no acute distress.  Skin: Skin is warm and dry. No rash noted.   Cardiovascular: Normal heart rate noted  Respiratory: Normal respiratory effort, no problems with respiration noted  Abdomen: Soft, gravid, appropriate for gestational age. Pain/Pressure: Present     Pelvic:  Cervical exam deferred        Extremities: Normal range of motion.  Edema: None  Mental Status: Normal mood and affect. Normal behavior. Normal judgment and thought content.   Urinalysis:      Assessment and Plan:  Pregnancy: G5P1031 at 3135w3d  1. Supervision of high risk pregnancy, antepartum - doing well  2. Antepartum cervical shortening - stable  Term labor symptoms and general obstetric precautions including but not limited to vaginal bleeding, contractions, leaking of fluid and fetal movement were reviewed in detail with the patient. Please refer to After Visit Summary for other counseling recommendations.  Return in about 1 week (around 09/22/2017) for ROB.   Brock BadHarper, Treysean Petruzzi A, MD

## 2017-09-17 ENCOUNTER — Other Ambulatory Visit: Payer: Self-pay

## 2017-09-17 ENCOUNTER — Inpatient Hospital Stay (HOSPITAL_COMMUNITY): Payer: Medicaid Other | Admitting: Anesthesiology

## 2017-09-17 ENCOUNTER — Encounter (HOSPITAL_COMMUNITY): Payer: Self-pay

## 2017-09-17 ENCOUNTER — Inpatient Hospital Stay (HOSPITAL_COMMUNITY)
Admission: AD | Admit: 2017-09-17 | Discharge: 2017-09-19 | DRG: 806 | Disposition: A | Discharge status: Home / Self Care | Payer: Medicaid Other | Source: Ambulatory Visit | Attending: Obstetrics & Gynecology | Admitting: Obstetrics & Gynecology

## 2017-09-17 DIAGNOSIS — Z3483 Encounter for supervision of other normal pregnancy, third trimester: Secondary | ICD-10-CM | POA: Diagnosis present

## 2017-09-17 DIAGNOSIS — O4292 Full-term premature rupture of membranes, unspecified as to length of time between rupture and onset of labor: Secondary | ICD-10-CM

## 2017-09-17 DIAGNOSIS — Z880 Allergy status to penicillin: Secondary | ICD-10-CM | POA: Diagnosis not present

## 2017-09-17 DIAGNOSIS — O99824 Streptococcus B carrier state complicating childbirth: Principal | ICD-10-CM | POA: Diagnosis present

## 2017-09-17 DIAGNOSIS — Z3A38 38 weeks gestation of pregnancy: Secondary | ICD-10-CM

## 2017-09-17 DIAGNOSIS — O26873 Cervical shortening, third trimester: Secondary | ICD-10-CM | POA: Diagnosis present

## 2017-09-17 DIAGNOSIS — O0990 Supervision of high risk pregnancy, unspecified, unspecified trimester: Secondary | ICD-10-CM

## 2017-09-17 DIAGNOSIS — O26879 Cervical shortening, unspecified trimester: Secondary | ICD-10-CM | POA: Diagnosis present

## 2017-09-17 LAB — TYPE AND SCREEN
ABO/RH(D): A POS
Antibody Screen: NEGATIVE

## 2017-09-17 LAB — CBC
HCT: 35.8 % — ABNORMAL LOW (ref 36.0–46.0)
Hemoglobin: 11.9 g/dL — ABNORMAL LOW (ref 12.0–15.0)
MCH: 27.9 pg (ref 26.0–34.0)
MCHC: 33.2 g/dL (ref 30.0–36.0)
MCV: 84 fL (ref 78.0–100.0)
Platelets: 164 10*3/uL (ref 150–400)
RBC: 4.26 MIL/uL (ref 3.87–5.11)
RDW: 13.5 % (ref 11.5–15.5)
WBC: 7.5 10*3/uL (ref 4.0–10.5)

## 2017-09-17 LAB — POCT FERN TEST: POCT Fern Test: NEGATIVE

## 2017-09-17 MED ORDER — EPHEDRINE 5 MG/ML INJ
10.0000 mg | INTRAVENOUS | Status: DC | PRN
  Filled 2017-09-17: qty 2

## 2017-09-17 MED ORDER — PENICILLIN G POTASSIUM 5000000 UNITS IJ SOLR
5.0000 10*6.[IU] | Freq: Once | INTRAVENOUS | Status: AC
  Administered 2017-09-17: 5 10*6.[IU] via INTRAVENOUS
  Filled 2017-09-17: qty 5

## 2017-09-17 MED ORDER — LIDOCAINE HCL (PF) 1 % IJ SOLN
INTRAMUSCULAR | Status: DC | PRN
  Administered 2017-09-17: 13 mL via EPIDURAL

## 2017-09-17 MED ORDER — OXYCODONE-ACETAMINOPHEN 5-325 MG PO TABS: 1.0000 | ORAL_TABLET | ORAL | Status: DC | PRN

## 2017-09-17 MED ORDER — LIDOCAINE HCL (PF) 1 % IJ SOLN
30.0000 mL | INTRAMUSCULAR | Status: DC | PRN
  Filled 2017-09-17: qty 30

## 2017-09-17 MED ORDER — DIPHENHYDRAMINE HCL 50 MG/ML IJ SOLN: 12.5000 mg | INTRAMUSCULAR | Status: DC | PRN

## 2017-09-17 MED ORDER — DIPHENHYDRAMINE HCL 25 MG PO CAPS: 25.0000 mg | ORAL_CAPSULE | Freq: Four times a day (QID) | ORAL | Status: DC | PRN

## 2017-09-17 MED ORDER — LACTATED RINGERS IV SOLN
INTRAVENOUS | Status: DC
  Administered 2017-09-17: 16:00:00 via INTRAVENOUS

## 2017-09-17 MED ORDER — ONDANSETRON HCL 4 MG/2ML IJ SOLN
4.0000 mg | Freq: Four times a day (QID) | INTRAMUSCULAR | Status: DC | PRN
  Administered 2017-09-17: 4 mg via INTRAVENOUS
  Filled 2017-09-17: qty 2

## 2017-09-17 MED ORDER — LACTATED RINGERS IV SOLN
500.0000 mL | Freq: Once | INTRAVENOUS | Status: AC
  Administered 2017-09-17: 500 mL via INTRAVENOUS

## 2017-09-17 MED ORDER — PRENATAL MULTIVITAMIN CH
1.0000 | ORAL_TABLET | Freq: Every day | ORAL | Status: DC
  Administered 2017-09-18: 1 via ORAL
  Filled 2017-09-17: qty 1

## 2017-09-17 MED ORDER — OXYTOCIN 40 UNITS IN LACTATED RINGERS INFUSION - SIMPLE MED
2.5000 [IU]/h | INTRAVENOUS | Status: DC
  Filled 2017-09-17: qty 1000

## 2017-09-17 MED ORDER — BENZOCAINE-MENTHOL 20-0.5 % EX AERO: 1.0000 "application " | INHALATION_SPRAY | CUTANEOUS | Status: DC | PRN

## 2017-09-17 MED ORDER — OXYTOCIN BOLUS FROM INFUSION
500.0000 mL | Freq: Once | INTRAVENOUS | Status: AC
  Administered 2017-09-17: 500 mL via INTRAVENOUS

## 2017-09-17 MED ORDER — SENNOSIDES-DOCUSATE SODIUM 8.6-50 MG PO TABS
2.0000 | ORAL_TABLET | ORAL | Status: DC
  Administered 2017-09-19: 2 via ORAL
  Filled 2017-09-17: qty 2

## 2017-09-17 MED ORDER — SOD CITRATE-CITRIC ACID 500-334 MG/5ML PO SOLN: 30.0000 mL | ORAL | Status: DC | PRN

## 2017-09-17 MED ORDER — ONDANSETRON HCL 4 MG PO TABS: 4.0000 mg | ORAL_TABLET | ORAL | Status: DC | PRN

## 2017-09-17 MED ORDER — IBUPROFEN 600 MG PO TABS
600.0000 mg | ORAL_TABLET | Freq: Four times a day (QID) | ORAL | Status: DC
  Administered 2017-09-18 – 2017-09-19 (×5): 600 mg via ORAL
  Filled 2017-09-17 (×5): qty 1

## 2017-09-17 MED ORDER — SIMETHICONE 80 MG PO CHEW: 80.0000 mg | CHEWABLE_TABLET | ORAL | Status: DC | PRN

## 2017-09-17 MED ORDER — ACETAMINOPHEN 325 MG PO TABS
650.0000 mg | ORAL_TABLET | ORAL | Status: DC | PRN
  Administered 2017-09-18: 650 mg via ORAL
  Filled 2017-09-17: qty 2

## 2017-09-17 MED ORDER — LACTATED RINGERS IV SOLN: 500.0000 mL | INTRAVENOUS | Status: DC | PRN

## 2017-09-17 MED ORDER — ZOLPIDEM TARTRATE 5 MG PO TABS: 5.0000 mg | ORAL_TABLET | Freq: Every evening | ORAL | Status: DC | PRN

## 2017-09-17 MED ORDER — COCONUT OIL OIL: 1.0000 "application " | TOPICAL_OIL | Status: DC | PRN

## 2017-09-17 MED ORDER — FENTANYL 2.5 MCG/ML BUPIVACAINE 1/10 % EPIDURAL INFUSION (WH - ANES)
14.0000 mL/h | INTRAMUSCULAR | Status: DC | PRN
  Administered 2017-09-17: 14 mL/h via EPIDURAL
  Filled 2017-09-17: qty 100

## 2017-09-17 MED ORDER — ONDANSETRON HCL 4 MG/2ML IJ SOLN: 4.0000 mg | INTRAMUSCULAR | Status: DC | PRN

## 2017-09-17 MED ORDER — OXYCODONE-ACETAMINOPHEN 5-325 MG PO TABS: 2.0000 | ORAL_TABLET | ORAL | Status: DC | PRN

## 2017-09-17 MED ORDER — ACETAMINOPHEN 325 MG PO TABS: 650.0000 mg | ORAL_TABLET | ORAL | Status: DC | PRN

## 2017-09-17 MED ORDER — WITCH HAZEL-GLYCERIN EX PADS
1.0000 | MEDICATED_PAD | CUTANEOUS | Status: DC | PRN
Start: 2017-09-17 — End: 2017-09-19

## 2017-09-17 MED ORDER — PHENYLEPHRINE 40 MCG/ML (10ML) SYRINGE FOR IV PUSH (FOR BLOOD PRESSURE SUPPORT)
80.0000 ug | PREFILLED_SYRINGE | INTRAVENOUS | Status: DC | PRN
  Filled 2017-09-17: qty 5

## 2017-09-17 MED ORDER — PHENYLEPHRINE 40 MCG/ML (10ML) SYRINGE FOR IV PUSH (FOR BLOOD PRESSURE SUPPORT)
80.0000 ug | PREFILLED_SYRINGE | INTRAVENOUS | Status: DC | PRN
  Filled 2017-09-17: qty 10
  Filled 2017-09-17: qty 5

## 2017-09-17 MED ORDER — TETANUS-DIPHTH-ACELL PERTUSSIS 5-2.5-18.5 LF-MCG/0.5 IM SUSP: 0.5000 mL | Freq: Once | INTRAMUSCULAR | Status: DC

## 2017-09-17 MED ORDER — PENICILLIN G POT IN DEXTROSE 60000 UNIT/ML IV SOLN
3.0000 10*6.[IU] | INTRAVENOUS | Status: DC
  Administered 2017-09-17: 3 10*6.[IU] via INTRAVENOUS
  Filled 2017-09-17 (×2): qty 50

## 2017-09-17 MED ORDER — DIBUCAINE 1 % RE OINT: 1.0000 "application " | TOPICAL_OINTMENT | RECTAL | Status: DC | PRN

## 2017-09-17 NOTE — H&P (Signed)
OBSTETRIC ADMISSION HISTORY AND PHYSICAL  [name] Schaefer is a 22 y.o. female [phone] with IUP at 3538w5d by LMP verified by US presenting for SOL. She reports +FMs, No LOF, no VB, no blurry vision, headaches or peripheral edema, and RUQ pain.  She plans on bottle feeding. She request nexplanon for birth control. She received her prenatal care at [place]   Dating: By LMP --->  Estimated Date of Delivery: 09/26/17  Sono:  08/21/17  @[redacted]w[redacted]d , CWD, normal anatomy, cephalic  presentation, anterior placenta , 2416g, 51% EFW  Clinic [facility] Prenatal Labs  Dating 7/13 US 10.5 weeks and LMP Blood type: A/Positive/-- (10/02 1646)   Genetic Screen 1 Screen:    AFP:     Quad:     NIPS: Antibody:Negative (10/02 1646)  Anatomic US 05-27-17 Rubella: immune   GTT Early:               Third trimester:  RPR: Non Reactive (12/14 1110)   Flu vaccine declined HBsAg: Negative (10/02 1646)   TDaP vaccine  01-16-17                                          Rhogam:A+ HIV: Non Reactive (12/14 1110) NR  Baby Food  bottle                                         YNW:GNFAOZHYGBS:Positive (01/14 1007)(For PCN allergy, check sensitivities)  Contraception Nexplanon QMV:HQIOPap:LSIL 2016, repeat after age 22  Circumcision yes   Pediatrician [facility]:  Support Person grandma SMA  Prenatal Classes no Hgb electrophoresis:normal   Prenatal History/Complications:  Past Medical History: Past Medical History:  Diagnosis Date  . Anxiety   . Depression   Cervical shortening   Past Surgical History: Past Surgical History:  Procedure Laterality Date  . INDUCED ABORTION      Obstetrical History: OB History    Gravida Para Term Preterm AB Living   5 1 1   3 1    SAB TAB Ectopic Multiple Live Births   1 2   0 1      Social History: Social History   Socioeconomic History  . Marital status: Single    Spouse name: None  . Number of children: None  . Years of education: None  . Highest education level: None  Social Needs  .  Financial resource strain: None  . Food insecurity - worry: None  . Food insecurity - inability: None  . Transportation needs - medical: None  . Transportation needs - non-medical: None  Occupational History  . None  Tobacco Use  . Smoking status: Never Smoker  . Smokeless tobacco: Never Used  Substance and Sexual Activity  . Alcohol use: No  . Drug use: No    Comment: Reports not drinking or using any drugs  . Sexual activity: Yes    Birth control/protection: None  Other Topics Concern  . None  Social History Narrative  . None    Family History: Family History  Problem Relation Age of Onset  . Hypertension Father   . Diabetes Father   . Diabetes Paternal Grandfather   . Stroke Paternal Grandfather   . Hypertension Paternal Grandfather   . Diabetes Maternal Grandmother   . Hypertension Maternal Grandmother   .  Diabetes Maternal Grandfather   . Hypertension Maternal Grandfather   . Diabetes Paternal Grandmother   . Hypertension Paternal Grandmother     Allergies: No Known Allergies  Medications Prior to Admission  Medication Sig Dispense Refill Last Dose  . Prenatal Vit-Fe Fumarate-FA (PRENATAL VITAMIN) 27-0.8 MG TABS Take 1 tablet by mouth daily. 90 tablet 5 09/16/2017 at Unknown time  . terconazole (TERAZOL 3) 0.8 % vaginal cream Place 1 applicator vaginally at bedtime. 20 g 0 Past Week at Unknown time  . Elastic Bandages & Supports (COMFORT FIT MATERNITY SUPP SM) MISC Wear as directed. (Patient not taking: Reported on 09/15/2017) 1 each 0 Not Taking at Unknown time  . promethazine (PHENERGAN) 12.5 MG tablet Take 1 tablet (12.5 mg total) by mouth every 6 (six) hours as needed for nausea or vomiting. Take a second tablet if still nauseous. (Patient not taking: Reported on 08/15/2017) 30 tablet 0 Not Taking at Unknown time     Review of Systems   All systems reviewed and negative except as stated in HPI  Blood pressure 119/78, pulse 84, temperature 98 F (36.7 C),  temperature source Oral, resp. rate 18, last menstrual period 12/20/2016, unknown if currently breastfeeding. General appearance: alert, cooperative and appears stated age Lungs: clear to auscultation bilaterally Heart: regular rate and rhythm Abdomen: soft, non-tender; bowel sounds normal Pelvic: 5/90/-1 Extremities: Homans sign is negative, no sign of DVT DTR's intact Presentation: cephalic Fetal monitoringBaseline: 125 bpm Uterine activityFrequency: Every 3-4 minutes Dilation: 5 Effacement (%): 90 Station: -1 Exam by:: Aundria Rud, CNM   Prenatal labs: ABO, Rh: A/Positive/-- (10/02 1646) Antibody: Negative (10/02 1646) Rubella: 10.70 (10/02 1646) RPR: Non Reactive (12/14 1110)  HBsAg: Negative (10/02 1646)  HIV: Non Reactive (12/14 1110)  GBS: Positive (01/14 1007)  1 hr Glucola not preformed  Genetic screening  Late to prenatal care  Anatomy US normal  Prenatal Transfer Tool  Maternal Diabetes: No Genetic Screening: late to prenatal care  Maternal Ultrasounds/Referrals: Normal Fetal Ultrasounds or other Referrals:  None Maternal Substance Abuse:  No Significant Maternal Medications:  None Significant Maternal Lab Results: None  No results found for this or any previous visit (from the past 24 hour(s)).  Patient Active Problem List   Diagnosis Date Noted  . 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 Supervision of high risk pregnancy, antepartum 05/20/2017    Assessment/Plan:  [name] Schaefer is a 22 y.o. Z6X0960 at [redacted]w[redacted]d here forSOL  #Labor:progressing normally #Pain: Epidural  #FWB: Category I #ID:  GBS + PCN #MOF: bottle #MOC:nexplanon #Circ:  Yes outpatient   Nigel Bridgeman, MD  09/17/2017, 3:36 PM

## 2017-09-17 NOTE — Anesthesia Preprocedure Evaluation (Signed)
Anesthesia Evaluation  Patient identified by MRN, date of birth, ID band Patient awake and Patient confused    Reviewed: Allergy & Precautions, H&P , NPO status , Patient's Chart, lab work & pertinent test results  Airway Mallampati: II   Neck ROM: full    Dental  (+) Teeth Intact   Pulmonary    Pulmonary exam normal breath sounds clear to auscultation       Cardiovascular Exercise Tolerance: Good  Rhythm:regular Rate:Normal     Neuro/Psych    GI/Hepatic   Endo/Other    Renal/GU      Musculoskeletal   Abdominal Normal abdominal exam  (+)   Peds  Hematology   Anesthesia Other Findings   Reproductive/Obstetrics (+) Pregnancy                             Anesthesia Physical  Anesthesia Plan  ASA: II  Anesthesia Plan: Epidural   Post-op Pain Management:    Induction:   PONV Risk Score and Plan:   Airway Management Planned:   Additional Equipment:   Intra-op Plan:   Post-operative Plan:   Informed Consent: I have reviewed the patients History and Physical, chart, labs and discussed the procedure including the risks, benefits and alternatives for the proposed anesthesia with the patient or authorized representative who has indicated his/her understanding and acceptance.     Plan Discussed with:   Anesthesia Plan Comments:         Anesthesia Quick Evaluation

## 2017-09-17 NOTE — MAU Provider Note (Signed)
S: [name] is a 22 y.o. [phone] at 4529w5d  who presents to MAU today complaining of leaking of fluid since 2 days ago. She reports trickling of fluid but denies big gush of fluid. She denies vaginal bleeding. She endorses contractions. She reports normal fetal movement.    O: BP 119/78 (BP Location: Right Arm)   Pulse 84   Temp 98 F (36.7 C) (Oral)   Resp 18   LMP 12/20/2016 (Exact Date)  GENERAL: Well-developed, well-nourished female in no acute distress.  HEAD: Normocephalic, atraumatic.  CHEST: Normal effort of breathing, regular heart rate ABDOMEN: Soft, nontender, gravid PELVIC: Normal external female genitalia. Vagina is pink and rugated. Cervix with normal contour, no lesions. Normal discharge.  Negative  pooling. BBOW present on speculum exam.   Cervical exam:  Dilation: 5 Effacement (%): 90 Station: -1 Presentation: Vertex Exam by:: Aundria Rudogers, CNM  Fetal Monitoring: Baseline: 125 Variability: moderate Accelerations: present  Decelerations: none Contractions: 3-5 minutes/ moderate   A: SIUP at 2329w5d  Membranes intact- BBOW  P: Admit to L&D for labor   Sharyon CableRogers, Davit Vassar C, CNM 09/17/2017 3:31 PM

## 2017-09-17 NOTE — Anesthesia Procedure Notes (Signed)
Epidural Patient location during procedure: OB Start time: 09/17/2017 4:13 PM End time: 09/17/2017 4:29 PM  Staffing Anesthesiologist: Lowella CurbMiller, Warren Ray, MD Performed: anesthesiologist   Preanesthetic Checklist Completed: patient identified, site marked, surgical consent, pre-op evaluation, timeout performed, IV checked, risks and benefits discussed and monitors and equipment checked  Epidural Patient position: sitting Prep: ChloraPrep Patient monitoring: heart rate, cardiac monitor, continuous pulse ox and blood pressure Approach: midline Location: L2-L3 Injection technique: LOR saline  Needle:  Needle type: Tuohy  Needle gauge: 17 G Needle length: 9 cm Needle insertion depth: 4 cm Catheter type: closed end flexible Catheter size: 20 Guage Catheter at skin depth: 7 cm Test dose: negative  Assessment Events: blood not aspirated, injection not painful, no injection resistance, negative IV test and no paresthesia  Additional Notes Reason for block:procedure for pain

## 2017-09-17 NOTE — Anesthesia Postprocedure Evaluation (Signed)
Anesthesia Post Note  Patient: [name] Schaefer  Procedure(s) Performed: AN AD HOC LABOR EPIDURAL     Patient location during evaluation: Mother Baby Anesthesia Type: Epidural Level of consciousness: awake and alert Pain management: pain level controlled Vital Signs Assessment: post-procedure vital signs reviewed and stable Respiratory status: spontaneous breathing, nonlabored ventilation and respiratory function stable Cardiovascular status: stable Postop Assessment: no headache, no backache and epidural receding Anesthetic complications: no    Last Vitals:  Vitals:   09/17/17 2155 09/17/17 2200  BP: 111/65 111/66  Pulse: 100 90  Resp:    Temp:    SpO2:      Last Pain:  Vitals:   09/17/17 1930  TempSrc: Oral  PainSc: 0-No pain   Pain Goal: Patients Stated Pain Goal: 8 (09/17/17 1603)               Kwamaine Cuppett

## 2017-09-17 NOTE — MAU Note (Signed)
Pt presents to MAU with c/o PROM, she has felt leaking of clear fluid x 3 days.. Ctx started this morning 1000. Pt denies VB. +FM

## 2017-09-17 NOTE — Anesthesia Pain Management Evaluation Note (Signed)
  CRNA Pain Management Visit Note  Patient: [name] Schaefer, 22 y.o., female  "Hello I am a member of the anesthesia [facility] at [facility]. We have an anesthesia team available at all times to provide care throughout the hospital, including epidural management and anesthesia for C-section. I don't know your plan for the delivery whether it a natural birth, water birth, IV sedation, nitrous supplementation, doula or epidural, but we want to meet your pain goals."   1.Was your pain managed to your expectations on prior hospitalizations?   Yes   2.What is your expectation for pain management during this hospitalization?     Epidural  3.How can we help you reach that goal?  Maintain epidural until delivery.   Record the patient's initial score and the patient's pain goal.   Pain: 0  Pain Goal: 1 [facility] wants you to be able to say your pain was always managed very well.  Ema Hebner 09/17/2017

## 2017-09-18 LAB — RAPID URINE DRUG SCREEN, HOSP PERFORMED
Amphetamines: NOT DETECTED
BENZODIAZEPINES: NOT DETECTED
Barbiturates: NOT DETECTED
COCAINE: NOT DETECTED
Opiates: NOT DETECTED
TETRAHYDROCANNABINOL: NOT DETECTED

## 2017-09-18 LAB — RPR: RPR Ser Ql: NONREACTIVE

## 2017-09-18 NOTE — Addendum Note (Signed)
Addendum  created 09/18/17 0948 by Shanon PayorGregory, Hollee Fate M, CRNA   Charge Capture section accepted, Sign clinical note, Visit diagnoses modified
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
## 2017-09-18 NOTE — Anesthesia Postprocedure Evaluation (Signed)
Anesthesia Post Note  Patient: [name]  Procedure(s) Performed: AN AD HOC LABOR EPIDURAL     Patient location during evaluation: Mother Baby Anesthesia Type: Epidural Level of consciousness: awake and alert and oriented Pain management: satisfactory to patient Vital Signs Assessment: post-procedure vital signs reviewed and stable Respiratory status: spontaneous breathing and nonlabored ventilation Cardiovascular status: stable Postop Assessment: no headache, no backache, no signs of nausea or vomiting, adequate PO intake and patient able to bend at knees (patient up walking) Anesthetic complications: no    Last Vitals:  Vitals:   09/18/17 0031 09/18/17 0500  BP: 104/60 116/64  Pulse: 81 77  Resp: 18 18  Temp: 36.7 C 36.7 C  SpO2:      Last Pain:  Vitals:   09/18/17 0500  TempSrc: Oral  PainSc: 4    Pain Goal: Patients Stated Pain Goal: 8 (09/17/17 1603)               Madison HickmanGREGORY,Chisum Habenicht

## 2017-09-18 NOTE — Progress Notes (Signed)
POSTPARTUM PROGRESS NOTE  Post Partum Day 1  Subjective:  [name] Schaefer is a 22 y.o. [id number] s/p SVD at 6767w5d.  No acute events overnight.  Pt denies problems with ambulating, voiding or po intake.  She denies nausea or vomiting.  Pain is well controlled.  She has had flatus. She has not had bowel movement.  Lochia Minimal.   Objective: Blood pressure 104/60, pulse 81, temperature 98 F (36.7 C), temperature source Oral, resp. rate 18, height 5\' 1"  (1.549 m), weight 63.6 kg (140 lb 3.2 oz), last menstrual period 12/20/2016, SpO2 100 %, unknown if currently breastfeeding.  Physical Exam:  General: alert, cooperative and no distress Chest: no respiratory distress Heart:regular rate, distal pulses intact Abdomen: soft, nontender,  Uterine Fundus: firm, appropriately tender DVT Evaluation: No calf swelling or tenderness Extremities: trace edema Skin: warm, dry  Recent Labs    09/17/17 1540  HGB 11.9*  HCT 35.8*    Assessment/Plan: [name] Schaefer is a 22 y.o. [id number] s/p SVD at 4567w5d   PPD#1 - Doing well. Continue expectant management. SW and UDS pending. Contraception: nexplanon Feeding: bottle Dispo: Plan for discharge tomorrow.   LOS: 1 day   Alroy BailiffParker W Janson Lamar MD 09/18/2017, 5:10 AM

## 2017-09-19 NOTE — Progress Notes (Signed)
CSW spoke with CPS intake worker, Bernie CoveyPam Miller.  CPS informed CSW that CSW's report was accepted as a 72 (CPS will make contact with family within 72 hours). There are no barriers to infant d/c with MOB.  CPS will follow-up with family within 72 hours and will continue to provide resources and supports after d/c.  CPS worker will be [place].   Blaine HamperAngel Boyd-Gilyard, MSW, LCSW Clinical Social Work [phone]

## 2017-09-19 NOTE — Discharge Summary (Signed)
OB Discharge Summary     Patient Name: Brenton Grillsngelica R Mccaster DOB: 03/03/1996 MRN: [id number]  Date of admission: 09/17/2017 Delivering MD: Frederik PearEGELE, JULIE P   Date of discharge: 09/19/2017  Admitting diagnosis: CTX Intrauterine pregnancy: 7251w5d     Secondary diagnosis:  Principal Problem:   SVD (spontaneous vaginal delivery) Active Problems:   Supervision of high risk pregnancy, antepartum   Cervical shortening     Discharge diagnosis: Term Pregnancy Delivered                                                                                                Post partum procedures:n/a  Augmentation: AROM  Complications: None  Hospital course:  Onset of Labor With Vaginal Delivery     22 y.o. yo [id number] at 6551w5d was admitted in Active Labor on 09/17/2017. Patient had an uncomplicated labor course as follows:  Membrane Rupture Time/Date: 9:26 PM ,09/17/2017   Intrapartum Procedures: Episiotomy: None [1]                                         Lacerations:  None [1]  Patient had a delivery of a Viable infant. 09/17/2017  Information for the patient's newborn:  Michela PitcherSmyre, Boy Enrica [[id number]][[id number]]  Delivery Method: Vaginal, Spontaneous(Filed from Delivery Summary)    Pateint had an uncomplicated postpartum course.  She is ambulating, tolerating a regular diet, passing flatus, and urinating well. Patient is discharged home in stable condition on 09/19/17.   Physical exam  Vitals:   09/18/17 0031 09/18/17 0500 09/18/17 1817 09/19/17 0500  BP: 104/60 116/64 108/64   Pulse: 81 77 72   Resp: 18 18 18    Temp: 98 F (36.7 C) 98 F (36.7 C) 98.1 F (36.7 C)   TempSrc: Oral Oral Oral   SpO2:      Weight:  58.1 kg (128 lb)  61.5 kg (135 lb 9.6 oz)  Height:       General: alert, cooperative and no distress Lochia: appropriate Uterine Fundus: firm Incision: N/A DVT Evaluation: No evidence of DVT seen on physical exam. Labs: Lab Results  Component Value Date   WBC 7.5 09/17/2017   HGB 11.9  (L) 09/17/2017   HCT 35.8 (L) 09/17/2017   MCV 84.0 09/17/2017   PLT 164 09/17/2017   CMP Latest Ref Rng & Units 01/02/2016  Glucose 65 - 99 mg/dL -  BUN 6 - 20 mg/dL -  Creatinine 5.280.44 - 4.131.00 mg/dL -  Sodium 244135 - 010145 mmol/L -  Potassium 3.5 - 5.1 mmol/L -  Chloride 101 - 111 mmol/L -  CO2 22 - 32 mmol/L -  Calcium 8.9 - 10.3 mg/dL -  Total Protein 6.5 - 8.1 g/dL 7.6  Total Bilirubin 0.3 - 1.2 mg/dL 0.8  Alkaline Phos 38 - 126 U/L 86  AST 15 - 41 U/L 17  ALT 14 - 54 U/L 16    Discharge instruction: per After Visit Summary and "Baby and Me Booklet".  After visit meds:  Allergies  as of 09/19/2017   No Known Allergies     Medication List    STOP taking these medications   COMFORT FIT MATERNITY SUPP SM Misc   Prenatal Vitamin 27-0.8 MG Tabs   promethazine 12.5 MG tablet Commonly known as:  PHENERGAN     TAKE these medications   terconazole 0.8 % vaginal cream Commonly known as:  TERAZOL 3 Place 1 applicator vaginally at bedtime.       Diet: routine diet  Activity: Advance as tolerated. Pelvic rest for 6 weeks.   Outpatient follow up:1 week Follow up Appt: Future Appointments  Date Time Provider Department Center  09/22/2017  1:30 PM Brock Bad, MD [facility] None   Follow up Visit:No Follow-up on file.  Postpartum contraception: Nexplanon  Newborn Data: Live born female  Birth Weight: 6 lb 11.2 oz (3039 g) APGAR: 9, 9  Newborn Delivery   Birth date/time:  09/17/2017 21:40:00 Delivery type:  Vaginal, Spontaneous     Baby Feeding: Bottle Disposition: pending CPS consult   09/19/2017 Alroy Bailiff, MD

## 2017-09-22 ENCOUNTER — Encounter: Payer: Self-pay | Admitting: Obstetrics

## 2017-10-20 ENCOUNTER — Encounter: Payer: Self-pay | Admitting: Obstetrics

## 2017-10-20 ENCOUNTER — Encounter: Payer: Self-pay | Admitting: *Deleted

## 2017-10-20 ENCOUNTER — Ambulatory Visit (INDEPENDENT_AMBULATORY_CARE_PROVIDER_SITE_OTHER): Payer: Medicaid Other | Admitting: Obstetrics

## 2017-10-20 DIAGNOSIS — Z3049 Encounter for surveillance of other contraceptives: Secondary | ICD-10-CM

## 2017-10-20 DIAGNOSIS — Z1389 Encounter for screening for other disorder: Secondary | ICD-10-CM | POA: Diagnosis not present

## 2017-10-20 DIAGNOSIS — Z3202 Encounter for pregnancy test, result negative: Secondary | ICD-10-CM

## 2017-10-20 DIAGNOSIS — Z30017 Encounter for initial prescription of implantable subdermal contraceptive: Secondary | ICD-10-CM

## 2017-10-20 DIAGNOSIS — Z3009 Encounter for other general counseling and advice on contraception: Secondary | ICD-10-CM

## 2017-10-20 LAB — POCT URINE PREGNANCY: Preg Test, Ur: NEGATIVE

## 2017-10-20 MED ORDER — ETONOGESTREL 68 MG ~~LOC~~ IMPL
68.0000 mg | DRUG_IMPLANT | Freq: Once | SUBCUTANEOUS | Status: AC
  Administered 2017-10-20: 68 mg via SUBCUTANEOUS

## 2017-10-20 NOTE — Progress Notes (Signed)
Post Partum Exam  [name] Schaefer is a 22 y.o. Z6X0960G5P2032 female who presents for a postpartum visit. She is 6 weeks postpartum following a spontaneous vaginal delivery. I have fully reviewed the prenatal and intrapartum course. The delivery was at 38.5 gestational weeks.  Anesthesia: epidural. Postpartum course has been good. Baby's course has been good. Baby is feeding by bottle - Carn[employer] Good Start. Bleeding post delivery stopped. LMP: 10/16/2017. Bowel function is normal. Bladder function is normal. Patient is not sexually active. Contraception method is Nexplanon. Postpartum depression screening: SCORE 0  The following portions of the patient's history were reviewed and updated as appropriate: allergies, current medications, past family history, past medical history, past social history, past surgical history and problem list.  Review of Systems A comprehensive review of systems was negative.    Objective:  Last menstrual period 12/20/2016, unknown if currently breastfeeding.  General:  alert and no distress   Breasts:  inspection negative, no nipple discharge or bleeding, no masses or nodularity palpable  Lungs: clear to auscultation bilaterally  Heart:  regular rate and rhythm, S1, S2 normal, no murmur, click, rub or gallop  Abdomen: normal findings: no masses palpable and soft, non-tender   Vulva:  normal  Vagina: normal vagina  Cervix:  no cervical motion tenderness  Corpus: normal size, contour, position, consistency, mobility, non-tender  Adnexa:  no mass, fullness, tenderness  Rectal Exam: Not performed.          Nexplanon Procedure Note   PRE-OP DIAGNOSIS: desired long-term, reversible contraception  POST-OP DIAGNOSIS: Same  PROCEDURE: Nexplanon  placement Performing Provider: Brock BadHARLES A. HARPER MD  Patient education prior to procedure, explained risk, benefits of Nexplanon, reviewed alternative options. Patient reported understanding. Gave consent to continue with  procedure.   PROCEDURE:  Pregnancy Text :  Negative Site (check):      left arm         Sterile Preparation:   Betadinex3 Lot # [id number] Expiration Date 2021, AUG 13  Insertion site was selected 8 - 10 cm from medial epicondyle and marked along with guiding site using sterile marker. Procedure area was prepped and draped in a sterile fashion. 1% Lidocaine 1.5 ml given prior to procedure. Nexplanon  was inserted subcutaneously.Needle was removed from the insertion site. Nexplanon capsule was palpated by provider and patient to assure satisfactory placement. Dressing applied.  Followup: The patient tolerated the procedure well without complications.  Standard post-procedure care is explained and return precautions are given.  Brock BadHARLES A. HARPER MD   Assessment:    1. Postpartum care following vaginal delivery - doing well  2. Encounter for other general counseling and advice on contraception - wants Nexplanon  3. Encounter for initial prescription of implantable subdermal contraceptive Rx: - POCT urine pregnancy  4. Insertion of Nexplanon   Plan:   1. Contraception: Nexplanon 2. Continue PNV's 3. Follow up in: 2 weeks or as needed.    Brock BadHARLES A. HARPER MD

## 2017-11-06 ENCOUNTER — Ambulatory Visit: Payer: Self-pay | Admitting: Obstetrics

## 2017-12-07 ENCOUNTER — Encounter (HOSPITAL_COMMUNITY): Payer: Self-pay | Admitting: Emergency Medicine

## 2017-12-07 ENCOUNTER — Ambulatory Visit (HOSPITAL_COMMUNITY)
Admission: EM | Admit: 2017-12-07 | Discharge: 2017-12-07 | Disposition: A | Discharge status: Home / Self Care | Payer: Medicaid Other | Attending: Internal Medicine | Admitting: Internal Medicine

## 2017-12-07 DIAGNOSIS — Z3202 Encounter for pregnancy test, result negative: Secondary | ICD-10-CM | POA: Diagnosis not present

## 2017-12-07 DIAGNOSIS — R110 Nausea: Secondary | ICD-10-CM

## 2017-12-07 DIAGNOSIS — R1030 Lower abdominal pain, unspecified: Secondary | ICD-10-CM | POA: Diagnosis not present

## 2017-12-07 DIAGNOSIS — Z113 Encounter for screening for infections with a predominantly sexual mode of transmission: Secondary | ICD-10-CM | POA: Diagnosis not present

## 2017-12-07 DIAGNOSIS — Z202 Contact with and (suspected) exposure to infections with a predominantly sexual mode of transmission: Secondary | ICD-10-CM

## 2017-12-07 DIAGNOSIS — N898 Other specified noninflammatory disorders of vagina: Secondary | ICD-10-CM | POA: Diagnosis not present

## 2017-12-07 DIAGNOSIS — N730 Acute parametritis and pelvic cellulitis: Secondary | ICD-10-CM | POA: Diagnosis not present

## 2017-12-07 LAB — POCT URINALYSIS DIP (DEVICE)
GLUCOSE, UA: NEGATIVE mg/dL
Hgb urine dipstick: NEGATIVE
KETONES UR: 15 mg/dL — AB
NITRITE: NEGATIVE
PH: 6.5 (ref 5.0–8.0)
PROTEIN: 30 mg/dL — AB
Specific Gravity, Urine: 1.02 (ref 1.005–1.030)
Urobilinogen, UA: 1 mg/dL (ref 0.0–1.0)

## 2017-12-07 LAB — POCT PREGNANCY, URINE: PREG TEST UR: NEGATIVE

## 2017-12-07 MED ORDER — DOXYCYCLINE HYCLATE 100 MG PO CAPS: 100.0000 mg | ORAL_CAPSULE | Freq: Two times a day (BID) | ORAL | 0 refills | Status: AC

## 2017-12-07 MED ORDER — CEFTRIAXONE SODIUM 250 MG IJ SOLR
250.0000 mg | Freq: Once | INTRAMUSCULAR | Status: AC
  Administered 2017-12-07: 250 mg via INTRAMUSCULAR

## 2017-12-07 MED ORDER — CEFTRIAXONE SODIUM 250 MG IJ SOLR
INTRAMUSCULAR | Status: AC
  Filled 2017-12-07: qty 250

## 2017-12-07 NOTE — ED Provider Notes (Signed)
[facility]    CSN: [id number] Arrival date & time: 12/07/17  1201     History   Chief Complaint Chief Complaint  Patient presents with  . Abdominal Pain    HPI [name] Schaefer is a 22 y.o. female.   [name] Schaefer presents with complaints of low abdominal cramping which started 3 days ago and has been constant as well as vaginal "tingling and stinging" with grey vaginal discharge which started yesterday. States feels similar to STD she has had in the past. Mild urinary frequency but without dysuria. LMP 2 weeks ago, irregular periods due to nexplanon. Not breastfeeding. Sexually active with 1 partner and does not use condoms, states she is concerned about STD exposure. Mild nausea yesterday without vomiting or diarrhea. BM yesterday which was normal. Pain is 4/10 and constant. Hx anxiety/depression. Has had STD in the past, approximately 1-2 years ago, does not know which.    ROS per HPI.      Past Medical History:  Diagnosis Date  . Anxiety   . Depression     Patient Active Problem List   Diagnosis Date Noted  . SVD (spontaneous vaginal delivery) 09/17/2017  . 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 Supervision of high risk pregnancy, antepartum 05/20/2017    Past Surgical History:  Procedure Laterality Date  . INDUCED ABORTION      OB History    Gravida  5   Para  2   Term  2   Preterm      AB  3   Living  2     SAB  1   TAB  2   Ectopic      Multiple  0   Live Births  2            Home Medications    Prior to Admission medications   Medication Sig Start Date End Date Taking? Authorizing Provider  Etonogestrel (NEXPLANON Sims) Inject into the skin.   Yes [provider]  doxycycline (VIBRAMYCIN) 100 MG capsule Take 1 capsule (100 mg total) by mouth 2 (two) times daily for 14 days. 12/07/17 12/21/17  Georgetta HaberBurky, Marieelena Bartko B, NP  terconazole (TERAZOL 3) 0.8 % vaginal cream Place 1 applicator vaginally at bedtime. 09/08/17   Brock BadHarper,  Charles A, MD    Family History Family History  Problem Relation Age of Onset  . Hypertension Father   . Diabetes Father   . Diabetes Paternal Grandfather   . Stroke Paternal Grandfather   . Hypertension Paternal Grandfather   . Diabetes Maternal Grandmother   . Hypertension Maternal Grandmother   . Diabetes Maternal Grandfather   . Hypertension Maternal Grandfather   . Diabetes Paternal Grandmother   . Hypertension Paternal Grandmother     Social History Social History   Tobacco Use  . Smoking status: Never Smoker  . Smokeless tobacco: Never Used  Substance Use Topics  . Alcohol use: No  . Drug use: No    Comment: Reports not drinking or using any drugs     Allergies   Patient has no known allergies.   Review of Systems Review of Systems   Physical Exam Triage Vital Signs ED Triage Vitals [12/07/17 1207]  Enc Vitals Group     BP 132/74     Pulse Rate 76     Resp 16     Temp 98.3 F (36.8 C)     Temp Source Oral     SpO2 100 %     Weight  Height      Head Circumference      Peak Flow      Pain Score      Pain Loc      Pain Edu?      Excl. in GC?    No data found.  Updated Vital Signs BP 132/74 (BP Location: Left Arm)   Pulse 76   Temp 98.3 F (36.8 C) (Oral)   Resp 16   LMP 12/20/2016 (Exact Date)   SpO2 100%   Physical Exam  Constitutional: She is oriented to person, place, and time. She appears well-developed and well-nourished. No distress.  Cardiovascular: Normal rate, regular rhythm and normal heart sounds.  Pulmonary/Chest: Effort normal and breath sounds normal.  Abdominal: Soft. Bowel sounds are normal. There is no hepatosplenomegaly. There is tenderness in the suprapubic area. There is no rigidity, no rebound, no guarding, no CVA tenderness, no tenderness at McBurney's point and negative Murphy's sign.  Genitourinary: Uterus is tender. Cervix exhibits motion tenderness and discharge. Vaginal discharge found.  Genitourinary  Comments: Thick yellow vaginal discharge noted with CMT present  Neurological: She is alert and oriented to person, place, and time.  Skin: Skin is warm and dry.     UC Treatments / Results  Labs (all labs ordered are listed, but only abnormal results are displayed) Labs Reviewed  POCT URINALYSIS DIP (DEVICE) - Abnormal; Notable for the following components:      Result Value   Bilirubin Urine SMALL (*)    Ketones, ur 15 (*)    Protein, ur 30 (*)    Leukocytes, UA SMALL (*)    All other components within normal limits  POCT PREGNANCY, URINE  URINE CYTOLOGY ANCILLARY ONLY    EKG None Radiology No results found.  Procedures Procedures (including critical care time)  Medications Ordered in UC Medications  cefTRIAXone (ROCEPHIN) injection 250 mg (has no administration in time range)     Initial Impression / Assessment and Plan / UC Course  I have reviewed the triage vital signs and the nursing notes.  Pertinent labs & imaging results that were available during my care of the patient were reviewed by me and considered in my medical decision making (see chart for details).     Abd cramping and tenderness on exam and with CMT tenderness, concern for possible STD exposure as well. Urine cytology pending. Will notify of any positive findings and if any changes to treatment are needed.  Will treat as PID at this time. Patient verbalized understanding and agreeable to plan.    Final Clinical Impressions(s) / UC Diagnoses   Final diagnoses:  PID (acute pelvic inflammatory disease)    ED Discharge Orders        Ordered    doxycycline (VIBRAMYCIN) 100 MG capsule  2 times daily     12/07/17 1236       Controlled Substance Prescriptions [place] Controlled Substance Registry consulted? Not Applicable   Georgetta Haber, NP 12/07/17 1244

## 2017-12-07 NOTE — ED Triage Notes (Signed)
C/o stomach cramping x3 days, states "I think I have an STD"

## 2017-12-07 NOTE — Discharge Instructions (Signed)
Complete course of antibiotics.  Please withhold from intercourse for the next week. Please use condoms to prevent STD's.   Will notify you of any positive findings and if any changes to treatment are needed.   If symptoms worsen or do not improve in the next week to return to be seen or to follow up with your PCP.

## 2017-12-08 LAB — URINE CYTOLOGY ANCILLARY ONLY
CHLAMYDIA, DNA PROBE: NEGATIVE
Neisseria Gonorrhea: NEGATIVE
TRICH (WINDOWPATH): NEGATIVE

## 2017-12-09 LAB — URINE CYTOLOGY ANCILLARY ONLY
Bacterial vaginitis: POSITIVE — AB
CANDIDA VAGINITIS: NEGATIVE

## 2017-12-10 ENCOUNTER — Telehealth (HOSPITAL_COMMUNITY): Payer: Self-pay

## 2017-12-10 NOTE — Telephone Encounter (Signed)
Bacterial vaginosis is positive. This was not treated at the urgent care visit.  Attempted to reach patient to assess need for flagyl, no answer. Message sent to MyChart.

## 2017-12-15 ENCOUNTER — Telehealth (HOSPITAL_COMMUNITY): Payer: Self-pay

## 2017-12-15 MED ORDER — METRONIDAZOLE 500 MG PO TABS: 500.0000 mg | ORAL_TABLET | Freq: Two times a day (BID) | ORAL | 0 refills | Status: DC

## 2017-12-15 NOTE — Telephone Encounter (Signed)
Pt called regarding results from recent visit. Bacterial vaginosis is positive. This was not treated at the urgent care visit.  Patient complains of persistent symptoms.  Flagyl 500 mg BID x 7 days #14 no refills sent to patients pharmacy of choice per Dr. Dayton Scrape.  Answered all questions and pt verbalized understanding.

## 2018-01-24 ENCOUNTER — Other Ambulatory Visit: Payer: Self-pay

## 2018-01-24 ENCOUNTER — Encounter (HOSPITAL_COMMUNITY): Payer: Self-pay | Admitting: Emergency Medicine

## 2018-01-24 ENCOUNTER — Emergency Department (HOSPITAL_COMMUNITY)
Admission: EM | Admit: 2018-01-24 | Discharge: 2018-01-24 | Disposition: A | Discharge status: Home / Self Care | Payer: Medicaid Other | Attending: Emergency Medicine | Admitting: Emergency Medicine

## 2018-01-24 ENCOUNTER — Emergency Department (HOSPITAL_COMMUNITY)
Admission: EM | Admit: 2018-01-24 | Discharge: 2018-01-24 | Disposition: A | Discharge status: Home / Self Care | Payer: Medicaid Other | Source: Home / Self Care | Attending: Emergency Medicine | Admitting: Emergency Medicine

## 2018-01-24 ENCOUNTER — Emergency Department (HOSPITAL_COMMUNITY): Payer: Medicaid Other

## 2018-01-24 DIAGNOSIS — Z5321 Procedure and treatment not carried out due to patient leaving prior to being seen by health care provider: Secondary | ICD-10-CM | POA: Diagnosis not present

## 2018-01-24 DIAGNOSIS — M542 Cervicalgia: Secondary | ICD-10-CM | POA: Diagnosis not present

## 2018-01-24 DIAGNOSIS — H1032 Unspecified acute conjunctivitis, left eye: Secondary | ICD-10-CM

## 2018-01-24 DIAGNOSIS — Y929 Unspecified place or not applicable: Secondary | ICD-10-CM | POA: Insufficient documentation

## 2018-01-24 DIAGNOSIS — L03213 Periorbital cellulitis: Secondary | ICD-10-CM

## 2018-01-24 DIAGNOSIS — R220 Localized swelling, mass and lump, head: Secondary | ICD-10-CM | POA: Diagnosis present

## 2018-01-24 DIAGNOSIS — Y9389 Activity, other specified: Secondary | ICD-10-CM | POA: Diagnosis not present

## 2018-01-24 DIAGNOSIS — Y998 Other external cause status: Secondary | ICD-10-CM | POA: Insufficient documentation

## 2018-01-24 DIAGNOSIS — S022XXA Fracture of nasal bones, initial encounter for closed fracture: Secondary | ICD-10-CM

## 2018-01-24 LAB — POC URINE PREG, ED: Preg Test, Ur: NEGATIVE

## 2018-01-24 MED ORDER — OXYCODONE-ACETAMINOPHEN 5-325 MG PO TABS
1.0000 | ORAL_TABLET | Freq: Once | ORAL | Status: AC
  Administered 2018-01-24: 1 via ORAL
  Filled 2018-01-24: qty 1

## 2018-01-24 MED ORDER — POLYMYXIN B-TRIMETHOPRIM 10000-0.1 UNIT/ML-% OP SOLN: 1.0000 [drp] | OPHTHALMIC | Status: DC

## 2018-01-24 MED ORDER — ERYTHROMYCIN 5 MG/GM OP OINT
1.0000 "application " | TOPICAL_OINTMENT | Freq: Once | OPHTHALMIC | Status: AC
  Administered 2018-01-24: 1 via OPHTHALMIC
  Filled 2018-01-24: qty 3.5

## 2018-01-24 MED ORDER — FLUORESCEIN SODIUM 1 MG OP STRP
1.0000 | ORAL_STRIP | Freq: Once | OPHTHALMIC | Status: AC
  Administered 2018-01-24: 1 via OPHTHALMIC
  Filled 2018-01-24: qty 1

## 2018-01-24 MED ORDER — FLUORESCEIN SODIUM 1 MG OP STRP: 1.0000 | ORAL_STRIP | Freq: Once | OPHTHALMIC | Status: DC

## 2018-01-24 MED ORDER — HYDROCODONE-ACETAMINOPHEN 5-325 MG PO TABS: 1.0000 | ORAL_TABLET | Freq: Four times a day (QID) | ORAL | 0 refills | Status: DC | PRN

## 2018-01-24 MED ORDER — CLINDAMYCIN HCL 150 MG PO CAPS: 450.0000 mg | ORAL_CAPSULE | Freq: Three times a day (TID) | ORAL | 0 refills | Status: AC

## 2018-01-24 MED ORDER — TETRACAINE HCL 0.5 % OP SOLN
2.0000 [drp] | Freq: Once | OPHTHALMIC | Status: AC
  Administered 2018-01-24: 2 [drp] via OPHTHALMIC
  Filled 2018-01-24: qty 4

## 2018-01-24 MED ORDER — TETRACAINE HCL 0.5 % OP SOLN: 2.0000 [drp] | Freq: Once | OPHTHALMIC | Status: DC
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
## 2018-01-24 NOTE — Discharge Instructions (Addendum)
Please place one half an inch of your antibiotic eye ointment into your left eye 4 times a day for 5 days.  Please take Ibuprofen (Advil, motrin) and Tylenol (acetaminophen) to relieve your pain.  You may take up to 600 MG (3 pills) of normal strength ibuprofen every 8 hours as needed.  In between doses of ibuprofen you make take tylenol, up to 1,000 mg (two extra strength pills).  Do not take more than 3,000 mg tylenol in a 24 hour period.  Please check all medication labels as many medications such as pain and cold medications may contain tylenol.  Do not drink alcohol while taking these medications.  Do not take other NSAID'S while taking ibuprofen (such as aleve or naproxen).  Please take ibuprofen with food to decrease stomach upset.  You may have diarrhea from the antibiotics.  It is very important that you continue to take the antibiotics even if you get diarrhea unless a medical professional tells you that you may stop taking them.  If you stop too early the bacteria you are being treated for will become stronger and you may need different, more powerful antibiotics that have more side effects and worsening diarrhea.  Please stay well hydrated and consider probiotics as they may decrease the severity of your diarrhea.  Please be aware that if you take any hormonal contraception (birth control pills, nexplanon, the ring, etc) that your birth control will not work while you are taking antibiotics and you need to use back up protection as directed on the birth control medication information insert.   Today you received medications that may make you sleepy or impair your ability to make decisions.  For the next 24 hours please do not drive, operate heavy machinery, care for a small child with out another adult present, or perform any activities that may cause harm to you or someone else if you were to fall asleep or be impaired.   You are being prescribed a medication which may make you sleepy. Please  follow up of listed precautions for at least 24 hours after taking one dose.
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
## 2018-03-04 ENCOUNTER — Encounter (HOSPITAL_COMMUNITY): Payer: Self-pay | Admitting: Emergency Medicine

## 2018-03-04 ENCOUNTER — Ambulatory Visit (HOSPITAL_COMMUNITY)
Admission: EM | Admit: 2018-03-04 | Discharge: 2018-03-04 | Disposition: A | Discharge status: Home / Self Care | Payer: Medicaid Other | Attending: Internal Medicine | Admitting: Internal Medicine

## 2018-03-04 DIAGNOSIS — R102 Pelvic and perineal pain: Secondary | ICD-10-CM

## 2018-03-04 DIAGNOSIS — R300 Dysuria: Secondary | ICD-10-CM | POA: Diagnosis not present

## 2018-03-04 DIAGNOSIS — N760 Acute vaginitis: Secondary | ICD-10-CM

## 2018-03-04 DIAGNOSIS — N390 Urinary tract infection, site not specified: Secondary | ICD-10-CM | POA: Diagnosis not present

## 2018-03-04 LAB — POCT URINALYSIS DIP (DEVICE)
Bilirubin Urine: NEGATIVE
GLUCOSE, UA: NEGATIVE mg/dL
Nitrite: NEGATIVE
PROTEIN: 30 mg/dL — AB
Specific Gravity, Urine: 1.025 (ref 1.005–1.030)
Urobilinogen, UA: 0.2 mg/dL (ref 0.0–1.0)
pH: 6 (ref 5.0–8.0)

## 2018-03-04 MED ORDER — NITROFURANTOIN MONOHYD MACRO 100 MG PO CAPS: 100.0000 mg | ORAL_CAPSULE | Freq: Two times a day (BID) | ORAL | 0 refills | Status: AC

## 2018-03-04 MED ORDER — FLUCONAZOLE 200 MG PO TABS: ORAL_TABLET | ORAL | 0 refills | Status: DC

## 2018-03-04 MED ORDER — METRONIDAZOLE 500 MG PO TABS: 500.0000 mg | ORAL_TABLET | Freq: Two times a day (BID) | ORAL | 0 refills | Status: AC

## 2018-03-04 NOTE — ED Provider Notes (Signed)
[facility]    CSN: [id number] Arrival date & time: 03/04/18  1102     History   Chief Complaint Chief Complaint  Patient presents with  . Vaginal Discharge    HPI [name] Schaefer is a 22 y.o. female.   [name] Schaefer presents with complaints of pain with urination as well as pelvic pain which started this morning. Sensation of "Tingling and burning" is constant to vaginal area. No vaginal discharge. Denies previous similar.no back pain or fevers. No vaginal bleeding. No blood from urination. Has a nexplanon. Sexually active with one partner, does not use condoms. Unknown if any risk of STD exposure. Hx of trich, states did not feel like this. Without contributing medical history.     ROS per HPI.      Past Medical History:  Diagnosis Date  . Anxiety   . Depression     Patient Active Problem List   Diagnosis Date Noted  . SVD (spontaneous vaginal delivery) 09/17/2017  . 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 Supervision of high risk pregnancy, antepartum 05/20/2017    Past Surgical History:  Procedure Laterality Date  . INDUCED ABORTION      OB History    Gravida  5   Para  2   Term  2   Preterm      AB  3   Living  2     SAB  1   TAB  2   Ectopic      Multiple  0   Live Births  2            Home Medications    Prior to Admission medications   Medication Sig Start Date End Date Taking? Authorizing Provider  Etonogestrel (NEXPLANON [place]) Inject into the skin.    [provider]  fluconazole (DIFLUCAN) 200 MG tablet Take once today. Take second pill at completion of antibiotics. 03/04/18   Georgetta HaberBurky, Natalie B, NP  HYDROcodone-acetaminophen (NORCO/VICODIN) 5-325 MG tablet Take 1 tablet by mouth every 6 (six) hours as needed for severe pain. 01/24/18   Cristina GongHammond, Elizabeth W, PA-C  metroNIDAZOLE (FLAGYL) 500 MG tablet Take 1 tablet (500 mg total) by mouth 2 (two) times daily for 7 days. 03/04/18 03/11/18  Georgetta HaberBurky, Natalie B, NP    nitrofurantoin, macrocrystal-monohydrate, (MACROBID) 100 MG capsule Take 1 capsule (100 mg total) by mouth 2 (two) times daily for 5 days. 03/04/18 03/09/18  Georgetta HaberBurky, Natalie B, NP    Family History Family History  Problem Relation Age of Onset  . Hypertension Father   . Diabetes Father   . Diabetes Paternal Grandfather   . Stroke Paternal Grandfather   . Hypertension Paternal Grandfather   . Diabetes Maternal Grandmother   . Hypertension Maternal Grandmother   . Diabetes Maternal Grandfather   . Hypertension Maternal Grandfather   . Diabetes Paternal Grandmother   . Hypertension Paternal Grandmother     Social History Social History   Tobacco Use  . Smoking status: Never Smoker  . Smokeless tobacco: Never Used  Substance Use Topics  . Alcohol use: No  . Drug use: No    Comment: Reports not drinking or using any drugs     Allergies   Patient has no known allergies.   Review of Systems Review of Systems   Physical Exam Triage Vital Signs ED Triage Vitals [03/04/18 1112]  Enc Vitals Group     BP 117/72     Pulse Rate 79     Resp 18  Temp 98.2 F (36.8 C)     Temp Source Oral     SpO2 100 %     Weight      Height      Head Circumference      Peak Flow      Pain Score      Pain Loc      Pain Edu?      Excl. in GC?    No data found.  Updated Vital Signs BP 117/72 (BP Location: Left Arm)   Pulse 79   Temp 98.2 F (36.8 C) (Oral)   Resp 18   SpO2 100%    Physical Exam  Constitutional: She is oriented to person, place, and time. She appears well-developed and well-nourished. No distress.  Cardiovascular: Normal rate, regular rhythm and normal heart sounds.  Pulmonary/Chest: Effort normal and breath sounds normal.  Genitourinary: Cervix exhibits discharge. Cervix exhibits no motion tenderness. Right adnexum displays no tenderness. Left adnexum displays no tenderness. No tenderness in the vagina. Vaginal discharge found.  Genitourinary Comments: Mild  pelvic pain on palpation; thick white vaginal discharge noted with odor   Neurological: She is alert and oriented to person, place, and time.  Skin: Skin is warm and dry.     UC Treatments / Results  Labs (all labs ordered are listed, but only abnormal results are displayed) Labs Reviewed  POCT URINALYSIS DIP (DEVICE) - Abnormal; Notable for the following components:      Result Value   Ketones, ur TRACE (*)    Hgb urine dipstick MODERATE (*)    Protein, ur 30 (*)    Leukocytes, UA MODERATE (*)    All other components within normal limits  URINE CULTURE  CERVICOVAGINAL ANCILLARY ONLY    EKG None  Radiology No results found.  Procedures Procedures (including critical care time)  Medications Ordered in UC Medications - No data to display  Initial Impression / Assessment and Plan / UC Course  I have reviewed the triage vital signs and the nursing notes.  Pertinent labs & imaging results that were available during my care of the patient were reviewed by me and considered in my medical decision making (see chart for details).     Urinary symptoms as well as vaginal symptoms. Flagyl, diflucan as well as macrobid initiated. Vaginal cytology pending, urine culture pending. Return precautions provided. Patient verbalized understanding and agreeable to plan.    Final Clinical Impressions(s) / UC Diagnoses   Final diagnoses:  Urinary tract infection without hematuria, site unspecified  Acute vaginitis     Discharge Instructions     Drink plenty of water to empty bladder regularly. Avoid alcohol and caffeine as these may irritate the bladder.   Please withhold from intercourse for the next week. Do not drink alcohol while take metronidazole. 1 tab of diflucan today and then repeat at completion of antibiotics, for yeast.  Please use condoms to prevent STD's.   Will notify you of any positive findings and if any changes to treatment are needed.   If develop increased  pain, fever, vomiting, or otherwise worsening please return or go to Er.     ED Prescriptions    Medication Sig Dispense Auth. Provider   fluconazole (DIFLUCAN) 200 MG tablet Take once today. Take second pill at completion of antibiotics. 2 tablet Linus Mako B, NP   metroNIDAZOLE (FLAGYL) 500 MG tablet Take 1 tablet (500 mg total) by mouth 2 (two) times daily for 7 days. 14 tablet [place],  Barron Alvine, NP   nitrofurantoin, macrocrystal-monohydrate, (MACROBID) 100 MG capsule Take 1 capsule (100 mg total) by mouth 2 (two) times daily for 5 days. 10 capsule Georgetta Haber, NP     Controlled Substance Prescriptions [place] Controlled Substance Registry consulted? Not Applicable   Georgetta Haber, NP 03/04/18 1229

## 2018-03-04 NOTE — Discharge Instructions (Signed)
Drink plenty of water to empty bladder regularly. Avoid alcohol and caffeine as these may irritate the bladder.   Please withhold from intercourse for the next week. Do not drink alcohol while take metronidazole. 1 tab of diflucan today and then repeat at completion of antibiotics, for yeast.  Please use condoms to prevent STD's.   Will notify you of any positive findings and if any changes to treatment are needed.   If develop increased pain, fever, vomiting, or otherwise worsening please return or go to Er.

## 2018-03-04 NOTE — ED Triage Notes (Signed)
Pt sts vaginal irritation and discharge; pt sts some lower abd pain

## 2018-03-05 LAB — CERVICOVAGINAL ANCILLARY ONLY
BACTERIAL VAGINITIS: NEGATIVE
CANDIDA VAGINITIS: POSITIVE — AB
CHLAMYDIA, DNA PROBE: NEGATIVE
NEISSERIA GONORRHEA: NEGATIVE
Trichomonas: NEGATIVE

## 2018-09-15 ENCOUNTER — Ambulatory Visit (HOSPITAL_COMMUNITY)
Admission: EM | Admit: 2018-09-15 | Discharge: 2018-09-15 | Disposition: A | Discharge status: Home / Self Care | Payer: Self-pay | Attending: Family Medicine | Admitting: Family Medicine

## 2018-09-15 ENCOUNTER — Encounter (HOSPITAL_COMMUNITY): Payer: Self-pay

## 2018-09-15 DIAGNOSIS — N760 Acute vaginitis: Secondary | ICD-10-CM | POA: Insufficient documentation

## 2018-09-15 DIAGNOSIS — N3000 Acute cystitis without hematuria: Secondary | ICD-10-CM | POA: Insufficient documentation

## 2018-09-15 DIAGNOSIS — G43819 Other migraine, intractable, without status migrainosus: Secondary | ICD-10-CM | POA: Insufficient documentation

## 2018-09-15 LAB — POCT URINALYSIS DIP (DEVICE)
Bilirubin Urine: NEGATIVE
Bilirubin Urine: NEGATIVE
GLUCOSE, UA: NEGATIVE mg/dL
Glucose, UA: NEGATIVE mg/dL
HGB URINE DIPSTICK: NEGATIVE
HGB URINE DIPSTICK: NEGATIVE
KETONES UR: NEGATIVE mg/dL
Ketones, ur: NEGATIVE mg/dL
NITRITE: NEGATIVE
Nitrite: NEGATIVE
PH: 8.5 — AB (ref 5.0–8.0)
PROTEIN: NEGATIVE mg/dL
Protein, ur: NEGATIVE mg/dL
SPECIFIC GRAVITY, URINE: 1.02 (ref 1.005–1.030)
SPECIFIC GRAVITY, URINE: 1.02 (ref 1.005–1.030)
UROBILINOGEN UA: 1 mg/dL (ref 0.0–1.0)
UROBILINOGEN UA: 1 mg/dL (ref 0.0–1.0)
pH: 8.5 — ABNORMAL HIGH (ref 5.0–8.0)

## 2018-09-15 LAB — POCT PREGNANCY, URINE: Preg Test, Ur: NEGATIVE

## 2018-09-15 MED ORDER — TOPIRAMATE 50 MG PO TABS: 50.0000 mg | ORAL_TABLET | Freq: Every day | ORAL | 1 refills | Status: DC

## 2018-09-15 MED ORDER — FLUCONAZOLE 150 MG PO TABS: 150.0000 mg | ORAL_TABLET | Freq: Once | ORAL | 0 refills | Status: AC

## 2018-09-15 MED ORDER — CEPHALEXIN 500 MG PO CAPS: 500.0000 mg | ORAL_CAPSULE | Freq: Four times a day (QID) | ORAL | 0 refills | Status: DC

## 2018-09-15 NOTE — ED Triage Notes (Signed)
Pt presents migraine and abdominal pain.  Pt states the stomach pain started yesterday.  It hurts when she goes to the bathroom.

## 2018-09-15 NOTE — ED Provider Notes (Signed)
[facility]    CSN: [id number] Arrival date & time: 09/15/18  [id number]     History   Chief Complaint Chief Complaint  Patient presents with  . Abdominal Pain  . Migraine    HPI [name] Schaefer is a 23 y.o. female.   This is 23 year old woman who is an established patient here [facility].  She works at a call center.  Patient says she has migraines multiple times a night.  She has not tried any preventive medication.  She at one time saw a neurologist when she was young, but nobody has dealt with his ongoing problem with her since.  Pt presents with migraine and abdominal pain.  Pt states the stomach pain started yesterday.  It hurts when she goes to the bathroom. She has polyuria.  No fever or flank pain.  The abdominal pain is suprapubic       Past Medical History:  Diagnosis Date  . Anxiety   . Depression     Patient Active Problem List   Diagnosis Date Noted  . SVD (spontaneous vaginal delivery) 09/17/2017  . 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 Supervision of high risk pregnancy, antepartum 05/20/2017    Past Surgical History:  Procedure Laterality Date  . INDUCED ABORTION      OB History    Gravida  5   Para  2   Term  2   Preterm      AB  3   Living  2     SAB  1   TAB  2   Ectopic      Multiple  0   Live Births  2            Home Medications    Prior to Admission medications   Medication Sig Start Date End Date Taking? Authorizing Provider  cephALEXin (KEFLEX) 500 MG capsule Take 1 capsule (500 mg total) by mouth 4 (four) times daily. 09/15/18   Elvina SidleLauenstein, Jeane Cashatt, MD  Etonogestrel [facility](NEXPLANON [place]) Inject into the skin.    [provider]  fluconazole (DIFLUCAN) 150 MG tablet Take 1 tablet (150 mg total) by mouth once for 1 dose. Repeat if needed 09/15/18 09/15/18  Elvina SidleLauenstein, Avea Mcgowen, MD  topiramate (TOPAMAX) 50 MG tablet Take 1 tablet (50 mg total) by mouth at bedtime. 09/15/18   Elvina SidleLauenstein, Kyung Muto, MD     Family History Family History  Problem Relation Age of Onset  . Hypertension Father   . Diabetes Father   . Diabetes Paternal Grandfather   . Stroke Paternal Grandfather   . Hypertension Paternal Grandfather   . Diabetes Maternal Grandmother   . Hypertension Maternal Grandmother   . Diabetes Maternal Grandfather   . Hypertension Maternal Grandfather   . Diabetes Paternal Grandmother   . Hypertension Paternal Grandmother     Social History Social History   Tobacco Use  . Smoking status: Never Smoker  . Smokeless tobacco: Never Used  Substance Use Topics  . Alcohol use: No  . Drug use: No    Comment: Reports not drinking or using any drugs     Allergies   Patient has no known allergies.   Review of Systems Review of Systems   Physical Exam Triage Vital Signs ED Triage Vitals [09/15/18 1112]  Enc Vitals Group     BP 117/77     Pulse Rate 72     Resp 16     Temp 98.8 F (37.1 C)  Temp Source Oral     SpO2 100 %     Weight      Height      Head Circumference      Peak Flow      Pain Score 8     Pain Loc      Pain Edu?      Excl. in GC?    No data found.  Updated Vital Signs BP 117/77 (BP Location: Right Arm)   Pulse 72   Temp 98.8 F (37.1 C) (Oral)   Resp 16   SpO2 100%    Physical Exam   UC Treatments / Results  Labs (all labs ordered are listed, but only abnormal results are displayed) Labs Reviewed  POCT URINALYSIS DIP (DEVICE) - Abnormal; Notable for the following components:      Result Value   pH 8.5 (*)    Leukocytes, UA SMALL (*)    All other components within normal limits  POCT URINALYSIS DIP (DEVICE) - Abnormal; Notable for the following components:   pH 8.5 (*)    Leukocytes, UA SMALL (*)    All other components within normal limits  URINE CULTURE    EKG None  Radiology No results found.  Procedures Procedures (including critical care time)  Medications Ordered in UC Medications - No data to  display  Initial Impression / Assessment and Plan / UC Course  I have reviewed the triage vital signs and the nursing notes.  Pertinent labs & imaging results that were available during my care of the patient were reviewed by me and considered in my medical decision making (see chart for details).    Final Clinical Impressions(s) / UC Diagnoses   Final diagnoses:  Other migraine without status migrainosus, intractable  Acute cystitis without hematuria  Vaginitis and vulvovaginitis   Discharge Instructions   None    ED Prescriptions    Medication Sig Dispense Auth. Provider   fluconazole (DIFLUCAN) 150 MG tablet Take 1 tablet (150 mg total) by mouth once for 1 dose. Repeat if needed 2 tablet Elvina Sidle, MD   topiramate (TOPAMAX) 50 MG tablet Take 1 tablet (50 mg total) by mouth at bedtime. 30 tablet Elvina Sidle, MD   cephALEXin (KEFLEX) 500 MG capsule Take 1 capsule (500 mg total) by mouth 4 (four) times daily. 20 capsule Elvina Sidle, MD     Controlled Substance Prescriptions [place] Controlled Substance Registry consulted? Not Applicable   Elvina Sidle, MD 09/15/18 1141

## 2018-09-16 LAB — URINE CULTURE

## 2018-09-29 ENCOUNTER — Encounter (HOSPITAL_COMMUNITY): Payer: Self-pay

## 2018-09-29 ENCOUNTER — Inpatient Hospital Stay (HOSPITAL_COMMUNITY)
Admission: AD | Admit: 2018-09-29 | Discharge: 2018-09-29 | Disposition: A | Discharge status: Home / Self Care | Payer: Self-pay | Attending: Obstetrics & Gynecology | Admitting: Obstetrics & Gynecology

## 2018-09-29 ENCOUNTER — Other Ambulatory Visit: Payer: Self-pay

## 2018-09-29 DIAGNOSIS — B9689 Other specified bacterial agents as the cause of diseases classified elsewhere: Secondary | ICD-10-CM | POA: Insufficient documentation

## 2018-09-29 DIAGNOSIS — Z789 Other specified health status: Secondary | ICD-10-CM

## 2018-09-29 DIAGNOSIS — N760 Acute vaginitis: Secondary | ICD-10-CM | POA: Insufficient documentation

## 2018-09-29 DIAGNOSIS — G44221 Chronic tension-type headache, intractable: Secondary | ICD-10-CM | POA: Insufficient documentation

## 2018-09-29 LAB — URINALYSIS, ROUTINE W REFLEX MICROSCOPIC
Bilirubin Urine: NEGATIVE
Glucose, UA: NEGATIVE mg/dL
Hgb urine dipstick: NEGATIVE
Ketones, ur: NEGATIVE mg/dL
Nitrite: NEGATIVE
Protein, ur: NEGATIVE mg/dL
Specific Gravity, Urine: 1.025 (ref 1.005–1.030)
pH: 5.5 (ref 5.0–8.0)

## 2018-09-29 LAB — CBC WITH DIFFERENTIAL/PLATELET
Basophils Absolute: 0 10*3/uL (ref 0.0–0.1)
Basophils Relative: 1 %
Eosinophils Absolute: 0.1 10*3/uL (ref 0.0–0.5)
Eosinophils Relative: 1 %
HCT: 41.6 % (ref 36.0–46.0)
Hemoglobin: 13.8 g/dL (ref 12.0–15.0)
Lymphocytes Relative: 47 %
Lymphs Abs: 1.9 10*3/uL (ref 0.7–4.0)
MCH: 30.1 pg (ref 26.0–34.0)
MCHC: 33.2 g/dL (ref 30.0–36.0)
MCV: 90.6 fL (ref 80.0–100.0)
Monocytes Absolute: 0.3 10*3/uL (ref 0.1–1.0)
Monocytes Relative: 7 %
Neutro Abs: 1.7 10*3/uL (ref 1.7–7.7)
Neutrophils Relative %: 44 %
Platelets: 236 10*3/uL (ref 150–400)
RBC: 4.59 MIL/uL (ref 3.87–5.11)
RDW: 13.2 % (ref 11.5–15.5)
WBC: 3.9 10*3/uL — ABNORMAL LOW (ref 4.0–10.5)
nRBC: 0 % (ref 0.0–0.2)

## 2018-09-29 LAB — WET PREP, GENITAL
Sperm: NONE SEEN
Trich, Wet Prep: NONE SEEN
Yeast Wet Prep HPF POC: NONE SEEN

## 2018-09-29 LAB — URINALYSIS, MICROSCOPIC (REFLEX): Bacteria, UA: NONE SEEN

## 2018-09-29 LAB — POCT PREGNANCY, URINE: Preg Test, Ur: NEGATIVE

## 2018-09-29 LAB — LIPASE, BLOOD: Lipase: 27 U/L (ref 11–51)

## 2018-09-29 LAB — AMYLASE: Amylase: 60 U/L (ref 28–100)

## 2018-09-29 MED ORDER — BUTALBITAL-APAP-CAFFEINE 50-325-40 MG PO TABS: 1.0000 | ORAL_TABLET | Freq: Four times a day (QID) | ORAL | 0 refills | Status: DC | PRN

## 2018-09-29 MED ORDER — ACETAMINOPHEN 500 MG PO TABS
1000.0000 mg | ORAL_TABLET | Freq: Once | ORAL | Status: AC
  Administered 2018-09-29: 1000 mg via ORAL
  Filled 2018-09-29: qty 2

## 2018-09-29 MED ORDER — METRONIDAZOLE 500 MG PO TABS: 500.0000 mg | ORAL_TABLET | Freq: Two times a day (BID) | ORAL | 0 refills | Status: DC

## 2018-09-29 NOTE — Discharge Instructions (Signed)
General Headache Without Cause A headache is pain or discomfort that is felt around the head or neck area. There are many causes and types of headaches. In some cases, the cause may not be found. Follow these instructions at home: Watch your condition for any changes. Let your doctor know about them. Take these steps to help with your condition: Managing pain      Take over-the-counter and prescription medicines only as told by your doctor.  Lie down in a dark, quiet room when you have a headache.  If told, put ice on your head and neck area: ? Put ice in a plastic bag. ? Place a towel between your skin and the bag. ? Leave the ice on for 20 minutes, 2-3 times per day.  If told, put heat on the affected area. Use the heat source that your doctor recommends, such as a moist heat pack or a heating pad. ? Place a towel between your skin and the heat source. ? Leave the heat on for 20-30 minutes. ? Remove the heat if your skin turns bright red. This is very important if you are unable to feel pain, heat, or cold. You may have a greater risk of getting burned.  Keep lights dim if bright lights bother you or make your headaches worse. Eating and drinking  Eat meals on a regular schedule.  If you drink alcohol: ? Limit how much you use to:  0-1 drink a day for women.  0-2 drinks a day for men. ? Be aware of how much alcohol is in your drink. In the [place] one drink equals one 12 oz bottle of beer (355 mL), one 5 oz glass of wine (148 mL), or one 1 oz glass of hard liquor (44 mL).  Stop drinking caffeine, or reduce how much caffeine you drink. General instructions   Keep a journal to find out if certain things bring on headaches. For example, write down: ? What you eat and drink. ? How much sleep you get. ? Any change to your diet or medicines.  Get a massage or try other ways to relax.  Limit stress.  Sit up straight. Do not tighten (tense) your muscles.  Do not use any  products that contain nicotine or tobacco. This includes cigarettes, e-cigarettes, and chewing tobacco. If you need help quitting, ask your doctor.  Exercise regularly as told by your doctor.  Get enough sleep. This often means 7-9 hours of sleep each night.  Keep all follow-up visits as told by your doctor. This is important. Contact a doctor if:  Your symptoms are not helped by medicine.  You have a headache that feels different than the other headaches.  You feel sick to your stomach (nauseous) or you throw up (vomit).  You have a fever. Get help right away if:  Your headache gets very bad quickly.  Your headache gets worse after a lot of physical activity.  You keep throwing up.  You have a stiff neck.  You have trouble seeing.  You have trouble speaking.  You have pain in the eye or ear.  Your muscles are weak or you lose muscle control.  You lose your balance or have trouble walking.  You feel like you will pass out (faint) or you pass out.  You are mixed up (confused).  You have a seizure. Summary  A headache is pain or discomfort that is felt around the head or neck area.  There are many causes and   types of headaches. In some cases, the cause may not be found.  Keep a journal to help find out what causes your headaches. Watch your condition for any changes. Let your doctor know about them.  Contact a doctor if you have a headache that is different from usual, or if your headache is not helped by medicine.  Get help right away if your headache gets very bad, you throw up, you have trouble seeing, you lose your balance, or you have a seizure. This information is not intended to replace advice given to you by your health care provider. Make sure you discuss any questions you have with your health care provider. Document Released: 05/14/2008 Document Revised: 02/23/2018 Document Reviewed: 02/23/2018 Elsevier Interactive Patient Education  2019 Elsevier  Inc.  

## 2018-09-29 NOTE — MAU Note (Signed)
Been having headaches for a wk, wk and a half; tried Tylenol and Ibuprofen- doesn't help.  Been throwing up this morning, has this weird pain underneath her ribs for about 3 wks, came on gradually.  Has this strange d/c.

## 2018-09-29 NOTE — MAU Provider Note (Signed)
History     CSN: [id number]  Arrival date and time: 09/29/18 [id number]   First Provider Initiated Contact with Patient 09/29/18 [phone]      Chief Complaint  Patient presents with  . Abdominal Pain  . Headache  . Vaginal Discharge  . Emesis   [name] Schaefer is a 23 y.o. G5P2 not currently pregnant who presents to MAU with complaints of HA, RUQ abdominal pain, vomiting x1 and vaginal discharge. She reports HA has been occurring "entire life" has been seen multiple times for HA recently and prescribed Topamax by the ED which she reports does not work and has not taken medication in 2-3 days. Rates HA 4/10. She reports RUQ abdominal pain occurring for the past 3 weeks. Describes the pain as aching, rates 3/10- has not taken any medication for it today. Denies hx of alcohol abuse or hepatitis. She reports emesis this morning when she woke up. Vaginal discharge has been occurring for the past 2 weeks, describes as white frothy discharge with odor.    OB History    Gravida  5   Para  2   Term  2   Preterm      AB  3   Living  2     SAB  1   TAB  2   Ectopic      Multiple  0   Live Births  2           Past Medical History:  Diagnosis Date  . Anxiety   . Depression     Past Surgical History:  Procedure Laterality Date  . INDUCED ABORTION      Family History  Problem Relation Age of Onset  . Hypertension Father   . Diabetes Father   . Diabetes Paternal Grandfather   . Stroke Paternal Grandfather   . Hypertension Paternal Grandfather   . Diabetes Maternal Grandmother   . Hypertension Maternal Grandmother   . Diabetes Maternal Grandfather   . Hypertension Maternal Grandfather   . Diabetes Paternal Grandmother   . Hypertension Paternal Grandmother     Social History   Tobacco Use  . Smoking status: Never Smoker  . Smokeless tobacco: Never Used  Substance Use Topics  . Alcohol use: No  . Drug use: No    Comment: Reports not drinking or using any drugs     Allergies: No Known Allergies  Medications Prior to Admission  Medication Sig Dispense Refill Last Dose  . cephALEXin (KEFLEX) 500 MG capsule Take 1 capsule (500 mg total) by mouth 4 (four) times daily. 20 capsule 0   . Etonogestrel (NEXPLANON [place]) Inject into the skin.     Marland Kitchen. topiramate (TOPAMAX) 50 MG tablet Take 1 tablet (50 mg total) by mouth at bedtime. 30 tablet 1     Review of Systems  Constitutional: Negative.   Respiratory: Negative.   Cardiovascular: Negative.   Gastrointestinal: Positive for abdominal pain and vomiting.  Genitourinary: Positive for vaginal discharge. Negative for difficulty urinating, dysuria, frequency, pelvic pain and vaginal bleeding.  Neurological: Positive for headaches. Negative for dizziness and light-headedness.   Physical Exam   Blood pressure 120/79, pulse 82, temperature 98.2 F (36.8 C), temperature source Oral, resp. rate 16, weight 67.1 kg, SpO2 100 %, unknown if currently breastfeeding.  Physical Exam  Nursing note and vitals reviewed. Constitutional: She appears well-developed and well-nourished. No distress.  Cardiovascular: Normal rate, regular rhythm and normal heart sounds.  Respiratory: Effort normal and breath sounds normal. No  respiratory distress. She has no wheezes. She has no rales.  GI: Soft. She exhibits no distension and no mass. There is abdominal tenderness. There is no rebound and no guarding.  Tenderness in RUQ  Genitourinary:    Vaginal discharge present.     Genitourinary Comments: Pelvic exam: Cervix pink, visually closed, without lesion, moderate amount of yellow thin discharge with foul odor, vaginal walls and external genitalia normal Bimanual exam: Cervix 0/long/high, firm, anterior, positive CMT, uterus nontender, nonenlarged, adnexa without tenderness, enlargement, or mass     MAU Course  Procedures  MDM Orders Placed This Encounter  Procedures  . Wet prep, genital  . Urine Culture  . Urinalysis,  Routine w reflex microscopic  . Lipase, blood  . Amylase  . CBC with Differential/Platelet  . Urinalysis, Microscopic (reflex)  . Ambulatory referral to Neurology  . Pregnancy, urine POC    Results for orders placed or performed during the hospital encounter of 09/29/18 (from the past 24 hour(s))  Urinalysis, Routine w reflex microscopic     Status: Abnormal   Collection Time: 09/29/18  8:45 AM  Result Value Ref Range   Color, Urine YELLOW YELLOW   APPearance CLEAR CLEAR   Specific Gravity, Urine 1.025 1.005 - 1.030   pH 5.5 5.0 - 8.0   Glucose, UA NEGATIVE NEGATIVE mg/dL   Hgb urine dipstick NEGATIVE NEGATIVE   Bilirubin Urine NEGATIVE NEGATIVE   Ketones, ur NEGATIVE NEGATIVE mg/dL   Protein, ur NEGATIVE NEGATIVE mg/dL   Nitrite NEGATIVE NEGATIVE   Leukocytes, UA TRACE (A) NEGATIVE  Urinalysis, Microscopic (reflex)     Status: None   Collection Time: 09/29/18  8:45 AM  Result Value Ref Range   RBC / HPF 0-5 0 - 5 RBC/hpf   WBC, UA 11-20 0 - 5 WBC/hpf   Bacteria, UA NONE SEEN NONE SEEN   Squamous Epithelial / LPF 0-5 0 - 5  Pregnancy, urine POC     Status: None   Collection Time: 09/29/18  8:47 AM  Result Value Ref Range   Preg Test, Ur NEGATIVE NEGATIVE  Wet prep, genital     Status: Abnormal   Collection Time: 09/29/18  8:59 AM  Result Value Ref Range   Yeast Wet Prep HPF POC NONE SEEN NONE SEEN   Trich, Wet Prep NONE SEEN NONE SEEN   Clue Cells Wet Prep HPF POC PRESENT (A) NONE SEEN   WBC, Wet Prep HPF POC FEW (A) NONE SEEN   Sperm NONE SEEN   Lipase, blood     Status: None   Collection Time: 09/29/18  9:19 AM  Result Value Ref Range   Lipase 27 11 - 51 U/L  Amylase     Status: None   Collection Time: 09/29/18  9:19 AM  Result Value Ref Range   Amylase 60 28 - 100 U/L  CBC with Differential/Platelet     Status: Abnormal   Collection Time: 09/29/18  9:19 AM  Result Value Ref Range   WBC 3.9 (L) 4.0 - 10.5 K/uL   RBC 4.59 3.87 - 5.11 MIL/uL   Hemoglobin 13.8  12.0 - 15.0 g/dL   HCT 62.5 63.8 - 93.7 %   MCV 90.6 80.0 - 100.0 fL   MCH 30.1 26.0 - 34.0 pg   MCHC 33.2 30.0 - 36.0 g/dL   RDW 34.2 87.6 - 81.1 %   Platelets 236 150 - 400 K/uL   nRBC 0.0 0.0 - 0.2 %   Neutrophils Relative % 44 %  Neutro Abs 1.7 1.7 - 7.7 K/uL   Lymphocytes Relative 47 %   Lymphs Abs 1.9 0.7 - 4.0 K/uL   Monocytes Relative 7 %   Monocytes Absolute 0.3 0.1 - 1.0 K/uL   Eosinophils Relative 1 %   Eosinophils Absolute 0.1 0.0 - 0.5 K/uL   Basophils Relative 1 %   Basophils Absolute 0.0 0.0 - 0.1 K/uL   Labs WNL  Wet prep- positive for clue cells, will treat for BV based on clinical symptoms   Amb referral to Neuro for chronic migraines sent, unable to treat in MAU today due to patient driving and unable to find ride. Rx for Fioricet sent to pharmacy of choice.  Educated and discussed reasons to return to MAU. Pt stable at time of discharge.   Assessment and Plan   1. BV (bacterial vaginosis)   2. Not currently pregnant   3. Chronic tension-type headache, intractable    Discharge home  Follow up with Neuro for chronic headaches  Rx for Fioricet and flagyl  Urine culture pending   Allergies as of 09/29/2018   No Known Allergies     Medication List    STOP taking these medications   topiramate 50 MG tablet Commonly known as:  TOPAMAX     TAKE these medications   butalbital-acetaminophen-caffeine 50-325-40 MG tablet Commonly known as:  FIORICET, ESGIC Take 1-2 tablets by mouth every 6 (six) hours as needed for headache.   cephALEXin 500 MG capsule Commonly known as:  KEFLEX Take 1 capsule (500 mg total) by mouth 4 (four) times daily.   metroNIDAZOLE 500 MG tablet Commonly known as:  FLAGYL Take 1 tablet (500 mg total) by mouth 2 (two) times daily.   NEXPLANON [place] Inject into the skin.      Sharyon Cable CNM 09/29/2018, 9:47 AM

## 2018-09-30 LAB — URINE CULTURE: Culture: NO GROWTH

## 2018-09-30 LAB — GC/CHLAMYDIA PROBE AMP (~~LOC~~) NOT AT ARMC
Chlamydia: NEGATIVE
Neisseria Gonorrhea: NEGATIVE

## 2018-12-25 ENCOUNTER — Ambulatory Visit (INDEPENDENT_AMBULATORY_CARE_PROVIDER_SITE_OTHER)
Admission: RE | Admit: 2018-12-25 | Discharge: 2018-12-25 | Disposition: A | Discharge status: Home / Self Care | Payer: Self-pay | Source: Ambulatory Visit

## 2018-12-25 ENCOUNTER — Telehealth: Payer: Self-pay | Admitting: Family

## 2018-12-25 ENCOUNTER — Telehealth: Payer: Self-pay

## 2018-12-25 DIAGNOSIS — N898 Other specified noninflammatory disorders of vagina: Secondary | ICD-10-CM

## 2018-12-25 DIAGNOSIS — R319 Hematuria, unspecified: Secondary | ICD-10-CM

## 2018-12-25 DIAGNOSIS — R300 Dysuria: Secondary | ICD-10-CM

## 2018-12-25 MED ORDER — FLUCONAZOLE 150 MG PO TABS: 150.0000 mg | ORAL_TABLET | Freq: Every day | ORAL | 0 refills | Status: DC

## 2018-12-25 MED ORDER — METRONIDAZOLE 500 MG PO TABS: 500.0000 mg | ORAL_TABLET | Freq: Two times a day (BID) | ORAL | 0 refills | Status: DC

## 2018-12-25 MED ORDER — NITROFURANTOIN MONOHYD MACRO 100 MG PO CAPS: 100.0000 mg | ORAL_CAPSULE | Freq: Two times a day (BID) | ORAL | 0 refills | Status: DC

## 2018-12-25 NOTE — Discharge Instructions (Signed)
We are treating you for a yeast infection, bacterial infection and urinary tract infection. Take medication as prescribed If your symptoms do not improve you need to follow-up for an face evaluation.

## 2018-12-25 NOTE — Progress Notes (Signed)
Greater than 5 minutes, yet less than 10 minutes of time have been spent researching, coordinating, and implementing care for this patient today.  Thank you for the details you included in the comment boxes. Those details are very helpful in determining the best course of treatment for you and help [place] to provide the best care.  Given the symptoms you're having, this requires laboratory work and a face-to-face physical exam.  Based on what you shared with me, I feel your condition warrants further evaluation and I recommend that you be seen for a face to face office visit.     NOTE: If you entered your credit card information for this eVisit, you will not be charged. You may see a "hold" on your card for the $35 but that hold will drop off and you will not have a charge processed.  If you are having a true medical emergency please call 911.  If you need an urgent face to face visit, [place] has four urgent care centers for your convenience.    PLEASE NOTE: THE [facility] LOCATIONS AND URGENT CARE CLINICS DO NOT HAVE THE TESTING FOR CORONAVIRUS COVID19 AVAILABLE.  IF YOU FEEL YOU NEED THIS TEST YOU MUST GO TO A TRIAGE LOCATION AT ONE OF THE HOSPITAL EMERGENCY DEPARTMENTS   [email or web address] to reserve your spot online an avoid wait times  [facility] [address], [address] [place], [place] [address] Modified hours of operation: Monday-Friday, 12 PM to 6 PM  Saturday & Sunday 10 AM to 4 PM *Across the street from [employer]  [employer] (New Address!) [address] [place], [place] [address] *Just off [employer], across the road from [place]* Modified hours of operation: Monday-Friday, 12 PM to 6 PM  Closed Saturday & Sunday  [facility]'s modified hours of operation will be in effect from May 1 until May 31   The following sites will take your insurance:  . [facility]  [phone] Get Driving  Directions Find a Provider at this Location  [address] [place], [place] [address] . 10 am to 8 pm Monday-Friday . 12 pm to 8 pm Saturday-Sunday   . [facility]  [phone] Get Driving Directions Find a Provider at this Location  [address] [place], [place] [address] . 8 am to 8 pm Monday-Friday . 9 am to 6 pm Saturday . 11 am to 6 pm Sunday   . [facility] at [facility]  [phone] Get Driving Directions  [address] [place], [place] [address] . 8 am to 8 pm Monday-Friday . 8 am to 4 pm Saturday-Sunday   Your e-visit answers were reviewed by a board certified advanced clinical practitioner to complete your personal care plan.  Thank you for using e-Visits.

## 2018-12-25 NOTE — ED Provider Notes (Signed)
Virtual Visit via Video Note:  Deliliah R Yakubov  initiated request for Telemedicine visit with [facility] Urgent Care team. I connected with Daniya R Cheatwood  on 12/25/2018 at 1:43 PM  for a synchronized telemedicine visit using a video enabled HIPPA compliant telemedicine application. I verified that I am speaking with Jerusalen R Cordle  using two identifiers. Janace Aris, NP  was physically located in a [facility] Urgent care site and Mckenzi R Hyder was located at a different location.   The limitations of evaluation and management by telemedicine as well as the availability of in-person appointments were discussed. Patient was informed that she  may incur a bill ( including co-pay) for this virtual visit encounter. Wen R Askin  expressed understanding and gave verbal consent to proceed with virtual visit.     History of Present Illness:[name] Schaefer  is a 23 y.o. female presents with 2 days of dysuria, lower abdominal cramping, hematuria, vaginal discharge, vaginal itching and irritation. Symptoms have been constant. She has not done anything to treat the symptoms. She does have hx of BV and UTI which feels similar. She is currently sexually active with one partner, unprotected. She denies any concern for STDs. LMP was 3 weeks ago. She uses the implant for birth control. Denies any fever, chills, back pain, N,V.     Past Medical History:  Diagnosis Date  . Anxiety   . Depression     No Known Allergies      Observations/Objective: GENERAL APPEARANCE: Well developed, well nourished, alert and cooperative, and appears to be in no acute distress. HEAD: normocephalic. Non labored breathing, no dyspnea or distress Skin: Skin normal color  PSYCHIATRIC: The mental examination revealed the patient was oriented to person, place, and time. The patient was able to demonstrate good judgement and reason, without hallucinations, abnormal affect or abnormal behaviors during the examination.  Patient is not suicidal    Assessment and Plan: Based on hx and symptoms we will go ahead and cover for UTI, BV and yeast. Pt agreeable to this.  Agreeable to this   Follow Up Instructions: If your symptoms continue despite treatment you need to follow-up in person for further evaluation    I discussed the assessment and treatment plan with the patient. The patient was provided an opportunity to ask questions and all were answered. The patient agreed with the plan and demonstrated an understanding of the instructions.   The patient was advised to call back or seek an in-person evaluation if the symptoms worsen or if the condition fails to improve as anticipated.   Janace Aris, NP  12/25/2018 1:43 PM         Janace Aris, NP 12/25/18 1555

## 2018-12-26 ENCOUNTER — Other Ambulatory Visit: Payer: Self-pay

## 2018-12-26 ENCOUNTER — Ambulatory Visit (HOSPITAL_COMMUNITY)
Admission: EM | Admit: 2018-12-26 | Discharge: 2018-12-26 | Disposition: A | Discharge status: Home / Self Care | Payer: Self-pay | Attending: Family Medicine | Admitting: Family Medicine

## 2018-12-26 ENCOUNTER — Encounter (HOSPITAL_COMMUNITY): Payer: Self-pay | Admitting: Family Medicine

## 2018-12-26 DIAGNOSIS — R300 Dysuria: Secondary | ICD-10-CM | POA: Insufficient documentation

## 2018-12-26 DIAGNOSIS — N898 Other specified noninflammatory disorders of vagina: Secondary | ICD-10-CM | POA: Insufficient documentation

## 2018-12-26 LAB — POCT URINALYSIS DIP (DEVICE)
Bilirubin Urine: NEGATIVE
Glucose, UA: NEGATIVE mg/dL
Ketones, ur: NEGATIVE mg/dL
Nitrite: NEGATIVE
Protein, ur: NEGATIVE mg/dL
Specific Gravity, Urine: >=1.03 (ref 1.005–1.030)
Urobilinogen, UA: 1 mg/dL (ref 0.0–1.0)
pH: 6.5 (ref 5.0–8.0)

## 2018-12-26 MED ORDER — AZITHROMYCIN 250 MG PO TABS
1000.0000 mg | ORAL_TABLET | Freq: Once | ORAL | Status: AC
  Administered 2018-12-26: 11:00:00 1000 mg via ORAL

## 2018-12-26 MED ORDER — CEFTRIAXONE SODIUM 250 MG IJ SOLR
250.0000 mg | Freq: Once | INTRAMUSCULAR | Status: AC
  Administered 2018-12-26: 11:00:00 250 mg via INTRAMUSCULAR

## 2018-12-26 MED ORDER — AZITHROMYCIN 250 MG PO TABS
ORAL_TABLET | ORAL | Status: AC
  Filled 2018-12-26: qty 4

## 2018-12-26 MED ORDER — CEFTRIAXONE SODIUM 250 MG IJ SOLR
INTRAMUSCULAR | Status: AC
  Filled 2018-12-26: qty 250

## 2018-12-26 NOTE — ED Provider Notes (Signed)
[facility]   [id number] 12/26/18 Arrival Time: 1005  ASSESSMENT & PLAN:  1. Vaginal discharge   2. Dysuria      Discharge Instructions     You have been given the following medications today for treatment of suspected gonorrhea and/or chlamydia:  cefTRIAXone (ROCEPHIN) injection 250 mg azithromycin (ZITHROMAX) tablet 1,000 mg  Even though we have treated you today, we have sent testing for sexually transmitted infections. We will notify you of any positive results once they are received. If required, we will prescribe any medications you might need.  Please refrain from all sexual activity for at least the next seven days.     Pending: Labs Reviewed  CERVICOVAGINAL ANCILLARY ONLY   Will notify of any positive results. Instructed to refrain from sexual activity for at least seven days.  Reviewed expectations re: course of current medical issues. Questions answered. Outlined signs and symptoms indicating need for more acute intervention. Patient verbalized understanding. After Visit Summary given.   SUBJECTIVE: E-visit noted dated 12/25/2018 reviewed.  [name] Schaefer is a 23 y.o. female who reports mild dysuria described as burning. Some vaginal discharge. Onset gradual. First noticed a few days ago. Describes discharge as thick and white/yellow with mild odoer. Afebrile. No abdominal or pelvic pain. No n/v. No rashes or lesions. Sexually active with single female partner without regular condom use. OTC treatment: none reported.  No LMP recorded. Patient has had an implant.  ROS: As per HPI.  OBJECTIVE:  There were no vitals filed for this visit.   General appearance: alert, cooperative, appears stated age and no distress Throat: lips, mucosa, and tongue normal; teeth and gums normal CV: RRR Lungs: CTAB Back: no CVA tenderness; FROM at waist Abdomen: soft, non-tender GU: deferred Skin: warm and dry Psychological: alert and cooperative; normal mood  and affect.   Labs Reviewed  CERVICOVAGINAL ANCILLARY ONLY    No Known Allergies  Past Medical History:  Diagnosis Date  . Anxiety   . Depression    Family History  Problem Relation Age of Onset  . Hypertension Father   . Diabetes Father   . Diabetes Paternal Grandfather   . Stroke Paternal Grandfather   . Hypertension Paternal Grandfather   . Diabetes Maternal Grandmother   . Hypertension Maternal Grandmother   . Diabetes Maternal Grandfather   . Hypertension Maternal Grandfather   . Diabetes Paternal Grandmother   . Hypertension Paternal Grandmother    Social History   Socioeconomic History  . Marital status: Single    Spouse name: Not on file  . Number of children: Not on file  . Years of education: Not on file  . Highest education level: Not on file  Occupational History  . Not on file  Social Needs  . Financial resource strain: Not on file  . Food insecurity:    Worry: Not on file    Inability: Not on file  . Transportation needs:    Medical: Not on file    Non-medical: Not on file  Tobacco Use  . Smoking status: Never Smoker  . Smokeless tobacco: Never Used  Substance and Sexual Activity  . Alcohol use: No  . Drug use: No    Comment: Reports not drinking or using any drugs  . Sexual activity: Yes    Birth control/protection: Implant  Lifestyle  . Physical activity:    Days per week: Not on file    Minutes per session: Not on file  . Stress: Not on file  Relationships  . Social connections:    Talks on phone: Not on file    Gets together: Not on file    Attends religious service: Not on file    Active member of club or organization: Not on file    Attends meetings of clubs or organizations: Not on file    Relationship status: Not on file  . Intimate partner violence:    Fear of current or ex partner: Not on file    Emotionally abused: Not on file    Physically abused: Not on file    Forced sexual activity: Not on file  Other Topics Concern   . Not on file  Social History Narrative  . Not on file          Mardella LaymanHagler, Marie Borowski, MD 12/31/18 [phone]

## 2018-12-26 NOTE — ED Triage Notes (Signed)
Per pt she has been having painful urination with abdominal pain and burning in her vagina. Pt says some thick discharge with a slight smell. No fevers no chills

## 2018-12-26 NOTE — Discharge Instructions (Signed)

## 2018-12-27 LAB — URINE CULTURE

## 2018-12-28 ENCOUNTER — Telehealth (HOSPITAL_COMMUNITY): Payer: Self-pay | Admitting: Emergency Medicine

## 2018-12-28 LAB — CERVICOVAGINAL ANCILLARY ONLY
Bacterial vaginitis: POSITIVE — AB
Candida vaginitis: POSITIVE — AB
Chlamydia: NEGATIVE
Neisseria Gonorrhea: NEGATIVE
Trichomonas: POSITIVE — AB

## 2018-12-28 NOTE — Telephone Encounter (Signed)
Trichomonas is positive. Rx metronidazole was given at the urgent care visit. Pt needs education to please refrain from sexual intercourse for 7 days to give the medicine time to work. Sexual partners need to be notified and tested/treated. Condoms may reduce risk of reinfection. Recheck for further evaluation if symptoms are not improving.   Bacterial Vaginosis test is positive.  Prescription for metronidazole was given at the urgent care visit. Pt contacted regarding results. Answered all questions. Verbalized understanding.  Candida (yeast) is positive.  Prescription for fluconazole was given at the urgent care visit.    Patient contacted and made aware of all results, all questions answered.

## 2019-01-13 ENCOUNTER — Other Ambulatory Visit: Payer: Self-pay

## 2019-01-13 ENCOUNTER — Ambulatory Visit (HOSPITAL_COMMUNITY)
Admission: EM | Admit: 2019-01-13 | Discharge: 2019-01-13 | Disposition: A | Discharge status: Home / Self Care | Payer: Self-pay | Attending: Internal Medicine | Admitting: Internal Medicine

## 2019-01-13 ENCOUNTER — Encounter (HOSPITAL_COMMUNITY): Payer: Self-pay

## 2019-01-13 DIAGNOSIS — B373 Candidiasis of vulva and vagina: Secondary | ICD-10-CM | POA: Insufficient documentation

## 2019-01-13 DIAGNOSIS — B3731 Acute candidiasis of vulva and vagina: Secondary | ICD-10-CM

## 2019-01-13 LAB — POCT URINALYSIS DIP (DEVICE)
Bilirubin Urine: NEGATIVE
Glucose, UA: NEGATIVE mg/dL
Hgb urine dipstick: NEGATIVE
Ketones, ur: NEGATIVE mg/dL
Leukocytes,Ua: NEGATIVE
Nitrite: NEGATIVE
Protein, ur: NEGATIVE mg/dL
Specific Gravity, Urine: >=1.03 (ref 1.005–1.030)
Urobilinogen, UA: 0.2 mg/dL (ref 0.0–1.0)
pH: 6 (ref 5.0–8.0)

## 2019-01-13 LAB — POCT PREGNANCY, URINE: Preg Test, Ur: NEGATIVE

## 2019-01-13 MED ORDER — FLUCONAZOLE 200 MG PO TABS: 200.0000 mg | ORAL_TABLET | Freq: Once | ORAL | 0 refills | Status: AC

## 2019-01-13 NOTE — ED Provider Notes (Signed)
[facility]    CSN: [id number] Arrival date & time: 01/13/19  1003     History   Chief Complaint Chief Complaint  Patient presents with  . Vaginal Discharge    HPI [name] Schaefer is a 23 y.o. female with insignificant medical history presenting for vaginal itching, discharge, burning with frequent urination.  Patient states this is been going on for over a month.  Has been seen previously for these concerns.  And states that she completed metronidazole courses for both Trichomonas and BV with minimal alleviation of symptoms.  Patient states the itching is bugging her most.  Patient denies intercourse since last appointment on 12/26/2018, though is requesting to be tested again.   Patient denies abdominal, pelvic, vaginal pain.  Has Nexplanon in place (placed March 2018).  LMP beginning of May, though "was spotty ".  She also notes daily episodes of hematuria, though states this is better than last time she was seen.  Patient completed course of Macrobid 12/25/2018.  Denies fever, chills, myalgias, flank pain lesions.   Past Medical History:  Diagnosis Date  . Anxiety   . Depression     Patient Active Problem List   Diagnosis Date Noted  . SVD (spontaneous vaginal delivery) 09/17/2017  . 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 Supervision of high risk pregnancy, antepartum 05/20/2017    Past Surgical History:  Procedure Laterality Date  . INDUCED ABORTION      OB History    Gravida  5   Para  2   Term  2   Preterm      AB  3   Living  2     SAB  1   TAB  2   Ectopic      Multiple  0   Live Births  2            Home Medications    Prior to Admission medications   Medication Sig Start Date End Date Taking? Authorizing Provider  Etonogestrel (NEXPLANON Tyler) Inject into the skin.    [provider]  fluconazole (DIFLUCAN) 150 MG tablet Take 1 tablet (150 mg total) by mouth daily. 12/25/18   Dahlia Byes A, NP  fluconazole (DIFLUCAN) 200 MG  tablet Take 1 tablet (200 mg total) by mouth once for 1 dose. May repeat in 72 hours if needed 01/13/19 01/13/19  Hall-Potvin, [place], PA-C  metroNIDAZOLE (FLAGYL) 500 MG tablet Take 1 tablet (500 mg total) by mouth 2 (two) times daily. 12/25/18   Dahlia Byes A, NP  nitrofurantoin, macrocrystal-monohydrate, (MACROBID) 100 MG capsule Take 1 capsule (100 mg total) by mouth 2 (two) times daily. 12/25/18   Janace Aris, NP    Family History Family History  Problem Relation Age of Onset  . Hypertension Father   . Diabetes Father   . Diabetes Paternal Grandfather   . Stroke Paternal Grandfather   . Hypertension Paternal Grandfather   . Diabetes Maternal Grandmother   . Hypertension Maternal Grandmother   . Diabetes Maternal Grandfather   . Hypertension Maternal Grandfather   . Diabetes Paternal Grandmother   . Hypertension Paternal Grandmother     Social History Social History   Tobacco Use  . Smoking status: Never Smoker  . Smokeless tobacco: Never Used  Substance Use Topics  . Alcohol use: No  . Drug use: No    Comment: Reports not drinking or using any drugs     Allergies   Patient has no known allergies.  Review of Systems Review of Systems as per HPI   Physical Exam Triage Vital Signs ED Triage Vitals  Enc Vitals Group     BP 01/13/19 1027 112/78     Pulse Rate 01/13/19 1027 71     Resp 01/13/19 1027 16     Temp 01/13/19 1027 98.5 F (36.9 C)     Temp Source 01/13/19 1027 Oral     SpO2 01/13/19 1027 99 %     Weight 01/13/19 1026 130 lb (59 kg)     Height --      Head Circumference --      Peak Flow --      Pain Score 01/13/19 1026 7     Pain Loc --      Pain Edu? --      Excl. in GC? --    No data found.  Updated Vital Signs BP 112/78 (BP Location: Right Arm)   Pulse 71   Temp 98.5 F (36.9 C) (Oral)   Resp 16   Wt 130 lb (59 kg)   SpO2 99%   BMI 24.56 kg/m   Visual Acuity Right Eye Distance:   Left Eye Distance:   Bilateral Distance:     Right Eye Near:   Left Eye Near:    Bilateral Near:     Physical Exam Constitutional:      General: She is not in acute distress.    Appearance: She is normal weight.  Cardiovascular:     Rate and Rhythm: Normal rate.  Pulmonary:     Effort: Pulmonary effort is normal.  Abdominal:     General: Abdomen is flat. Bowel sounds are normal. There is no distension.     Palpations: Abdomen is soft.     Tenderness: There is no abdominal tenderness. There is no right CVA tenderness, left CVA tenderness or guarding.  Genitourinary:    Comments: Patient declined Neurological:     Mental Status: She is alert.      UC Treatments / Results  Labs (all labs ordered are listed, but only abnormal results are displayed) Labs Reviewed  RPR  HIV ANTIBODY (ROUTINE TESTING W REFLEX)  POC URINE PREG, ED  POCT URINALYSIS DIP (DEVICE)  POCT PREGNANCY, URINE  CERVICOVAGINAL ANCILLARY ONLY    EKG None  Radiology No results found.  Procedures Procedures (including critical care time)  Medications Ordered in UC Medications - No data to display  Initial Impression / Assessment and Plan / UC Course  I have reviewed the triage vital signs and the nursing notes.  Pertinent labs & imaging results that were available during my care of the patient were reviewed by me and considered in my medical decision making (see chart for details).     23 year old female presenting with vaginal itching and irritation.  Patient looks well in office, POCT urinalysis unremarkable.  Will treat with Diflucan.  STI pending, will treat if indicated. Final Clinical Impressions(s) / UC Diagnoses   Final diagnoses:  Vaginal yeast infection     Discharge Instructions     Take diflucan today, may repeat in 72 hours if needed. If you test positive for BV you may benefit from topical metrogel as you have not had resolution of symptoms with oral medication. Follow up to PCP to establish care and do routine PAP -  you may do this at the health dept as well. Follow up with GYN if your symptoms do not resolve or if you develop pelvic/vaginal pain, bleeding,  or worsening symptoms.    ED Prescriptions    Medication Sig Dispense Auth. Provider   fluconazole (DIFLUCAN) 200 MG tablet Take 1 tablet (200 mg total) by mouth once for 1 dose. May repeat in 72 hours if needed 2 tablet Hall-Potvin, [place], PA-C     Controlled Substance Prescriptions [place] Controlled Substance Registry consulted? Not Applicable   Shea Evans, [place] 01/13/19 1435

## 2019-01-13 NOTE — ED Triage Notes (Signed)
Pt cc she came in for f/u after taking the medicine for Trichomas  5/ 9/ 20 and pt states she still has the same symptoms vaginal discharge and burning when she voids.

## 2019-01-13 NOTE — Discharge Instructions (Addendum)
Take diflucan today, may repeat in 72 hours if needed. If you test positive for BV you may benefit from topical metrogel as you have not had resolution of symptoms with oral medication. Follow up to PCP to establish care and do routine PAP - you may do this at the health dept as well. Follow up with GYN if your symptoms do not resolve or if you develop pelvic/vaginal pain, bleeding, or worsening symptoms.

## 2019-01-14 LAB — CERVICOVAGINAL ANCILLARY ONLY
Bacterial vaginitis: POSITIVE — AB
Chlamydia: NEGATIVE
Neisseria Gonorrhea: NEGATIVE
Trichomonas: NEGATIVE

## 2019-01-14 LAB — HIV ANTIBODY (ROUTINE TESTING W REFLEX): HIV Screen 4th Generation wRfx: NONREACTIVE

## 2019-01-14 LAB — RPR: RPR Ser Ql: NONREACTIVE

## 2019-01-15 ENCOUNTER — Telehealth (HOSPITAL_COMMUNITY): Payer: Self-pay | Admitting: Emergency Medicine

## 2019-01-15 MED ORDER — METRONIDAZOLE 500 MG PO TABS: 500.0000 mg | ORAL_TABLET | Freq: Two times a day (BID) | ORAL | 0 refills | Status: AC

## 2019-01-15 NOTE — Telephone Encounter (Signed)
Bacterial vaginosis is positive. This was not treated at the urgent care visit.  Flagyl 500 mg BID x 7 days #14 no refills sent to patients pharmacy of choice.    Patient contacted and made aware of all results, all questions answered.  Pt to follow up with GYN for reoccuring BV infections, pt agreeable to plan.

## 2019-01-18 ENCOUNTER — Telehealth (HOSPITAL_COMMUNITY): Payer: Self-pay | Admitting: Emergency Medicine

## 2019-01-18 ENCOUNTER — Inpatient Hospital Stay: Admission: RE | Admit: 2019-01-18 | Payer: Self-pay | Source: Ambulatory Visit

## 2019-01-18 IMAGING — US US MFM OB COMP +14 WKS
1 series · 14 of 28 positions shown · non-contrast
Comparison: none

[Series 1: us mfm ob comp +14 wks · 94 acquisitions, 14 frames shown]
[im 4/94]
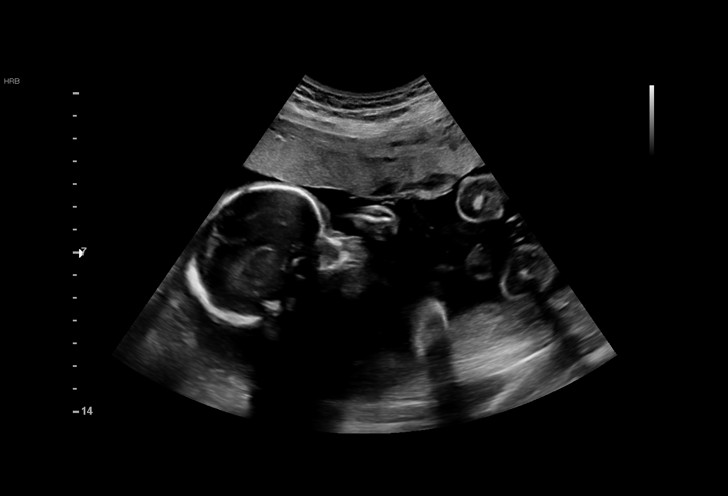
[im 11/94]
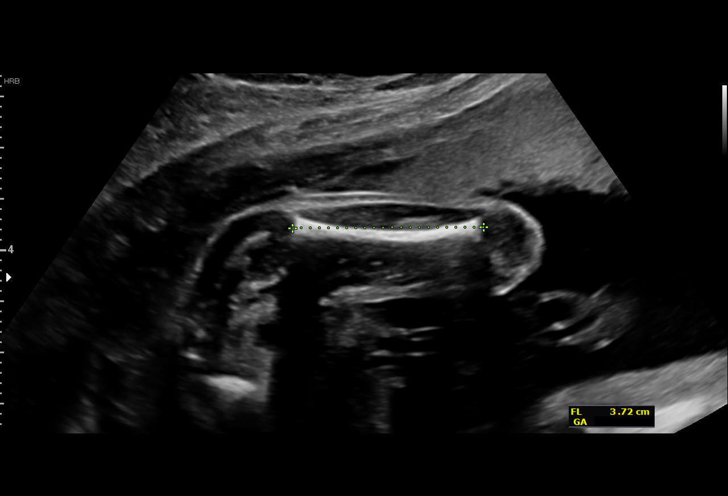
[im 18/94]
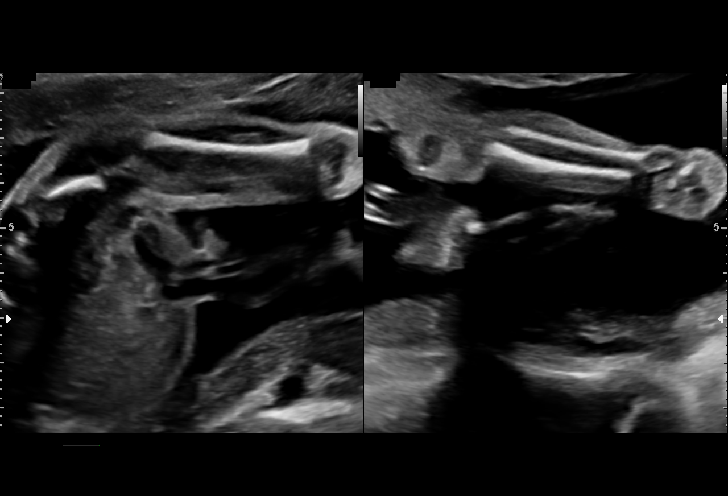
[im 25/94]
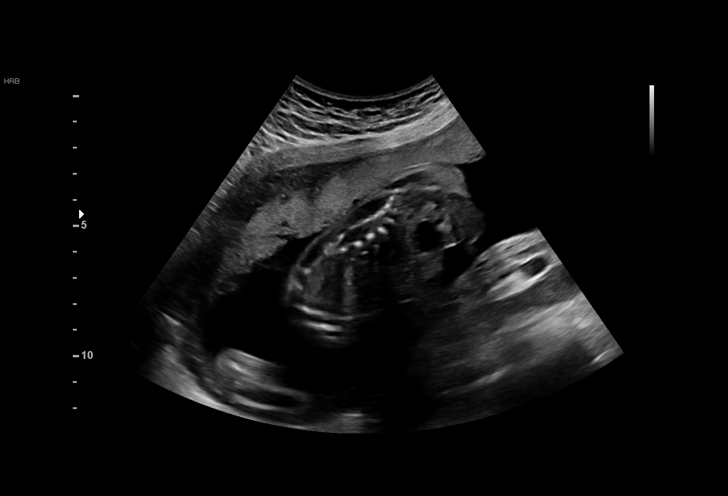
[im 32/94]
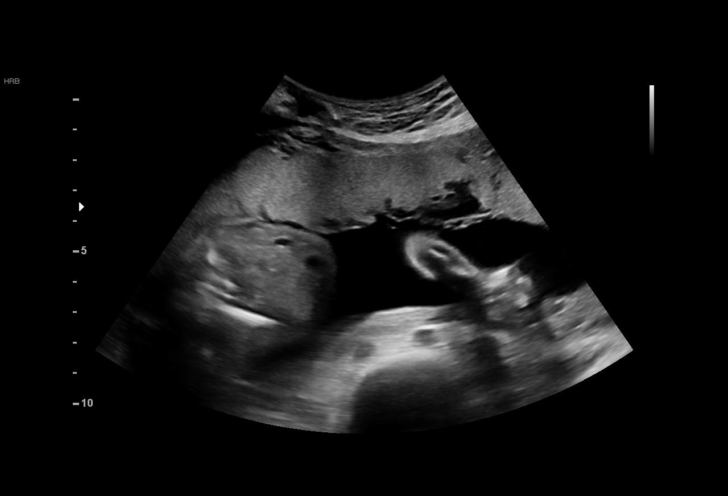
[im 38/94]
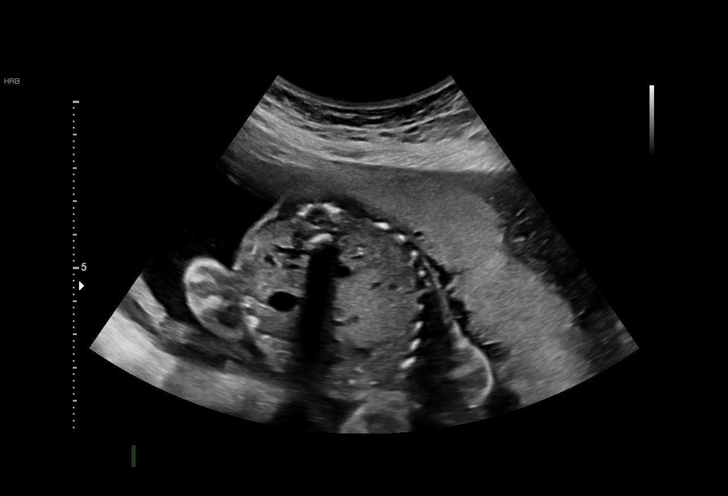
[im 45/94]
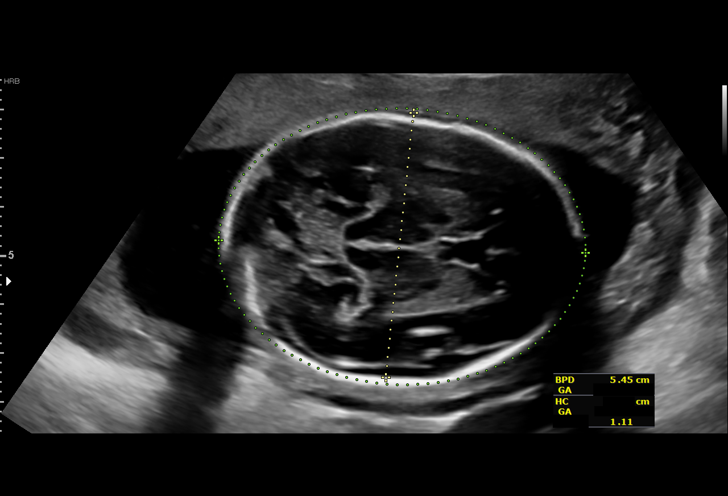
[im 52/94]
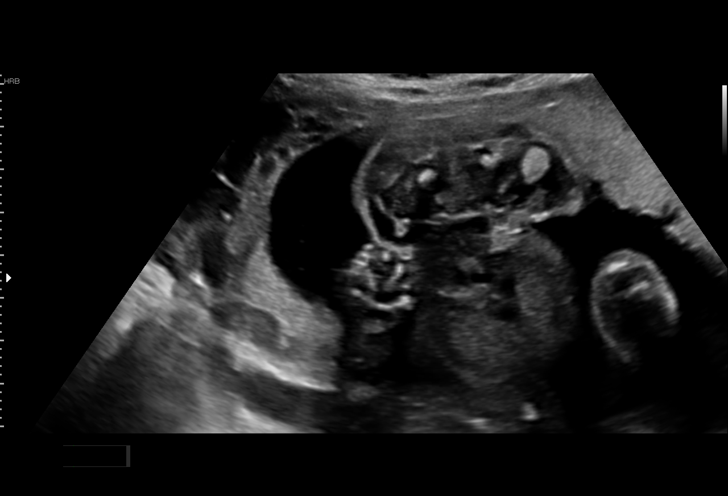
[im 59/94]
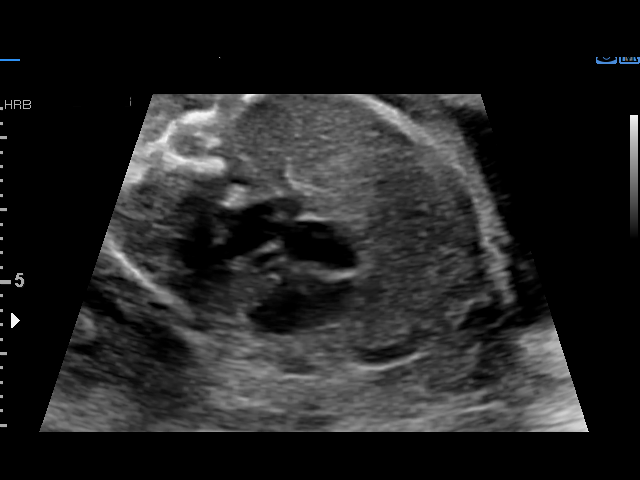
[im 66/94]
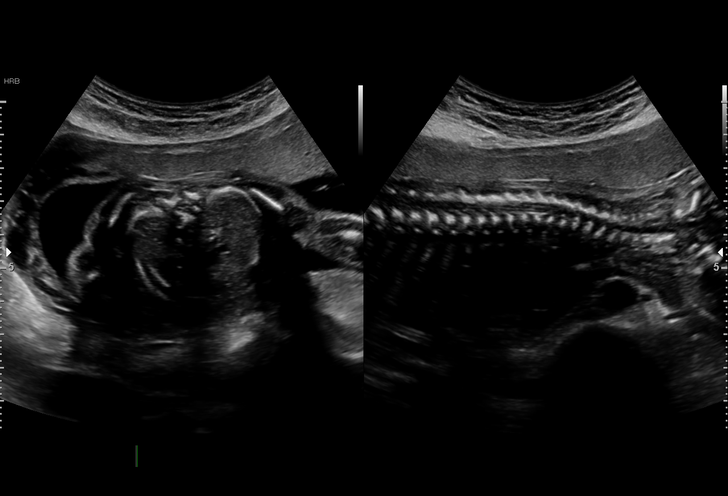
[im 73/94]
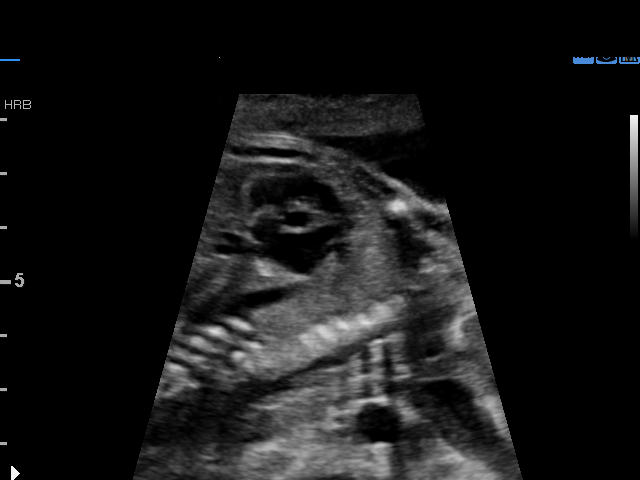
[im 80/94]
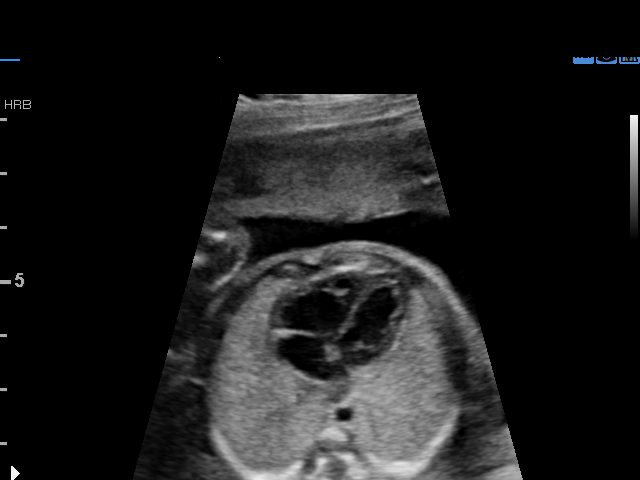
[im 87/94]
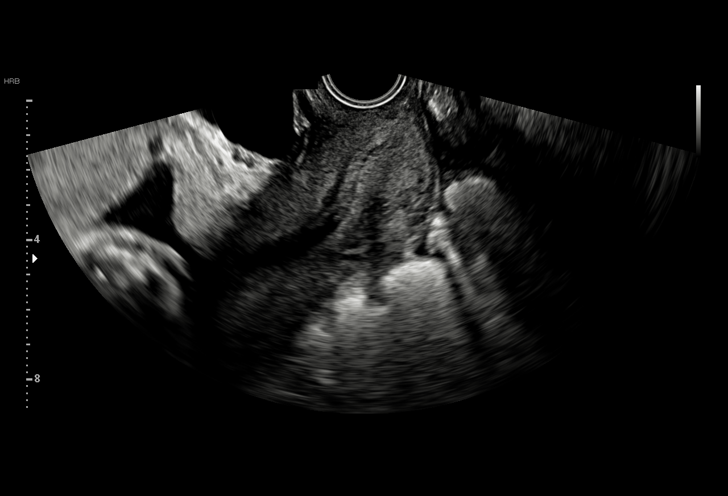
[im 94/94]
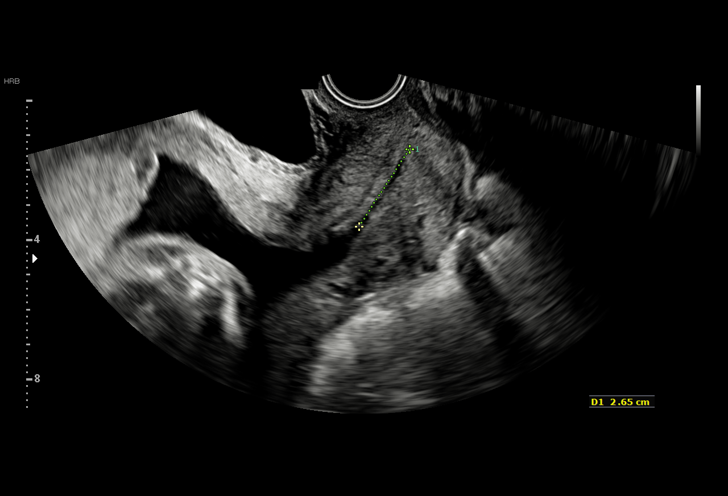

[14 of 28 positions shown; findings below may reference images not displayed]

1  KALSOONI FANCOURT            [id number]      [id number]     [id number]
2  KALSOONI FANCOURT            [id number]      [id number]     [id number]
Indications

22 weeks gestation of pregnancy
Encounter for fetal anatomic survey
Poor obstetrical history (specify): short cervix
at 25 weeks; delivered at term
OB History

Blood Type:            Height:  5'1"   Weight (lb):  127       BMI:
Gravidity:    5         Term:   1         SAB:   1
TOP:          2        Living:  1
Fetal Evaluation

Num Of Fetuses:     1
Fetal Heart         140
Rate(bpm):
Cardiac Activity:   Observed
Presentation:       Breech, footling
Placenta:           Anterior, above cervical os
P. Cord Insertion:  Visualized, central

Amniotic Fluid
AFI FV:      Subjectively within normal limits

Largest Pocket(cm)
5.93
Biometry
BPD:      54.2  mm     G. Age:  22w 3d         43  %    CI:        68.46   %    70 - 86
FL/HC:      17.9   %    19.2 -
HC:      209.4  mm     G. Age:  23w 0d         55  %    HC/AC:      1.12        1.05 -
AC:      186.6  mm     G. Age:  23w 3d         70  %    FL/BPD:     69.0   %    71 - 87
FL:       37.4  mm     G. Age:  22w 0d         21  %    FL/AC:      20.0   %    20 - 24
CER:      25.6  mm     G. Age:  23w 4d         65  %
CM:        3.6  mm

Est. FW:     534  gm      1 lb 3 oz     54  %
Gestational Age

LMP:           22w 4d        Date:  12/20/16                 EDD:   09/26/17
U/S Today:     22w 5d                                        EDD:   09/25/17
Best:          22w 4d     Det. By:  LMP  (12/20/16)          EDD:   09/26/17
Anatomy

Cranium:               Appears normal         Aortic Arch:            Appears normal
Cavum:                 Appears normal         Ductal Arch:            Appears normal
Ventricles:            Appears normal         Diaphragm:              Appears normal
Choroid Plexus:        Appears normal         Stomach:                Appears normal, left
sided
Cerebellum:            Appears normal         Abdomen:                Appears normal
Posterior Fossa:       Appears normal         Abdominal Wall:         Appears nml (cord
insert, abd wall)
Nuchal Fold:           Not applicable (>20    Cord Vessels:           Appears normal (3
wks GA)                                        vessel cord)
Face:                  Appears normal         Kidneys:                Appear normal
(orbits and profile)
Lips:                  Appears normal         Bladder:                Appears normal
Thoracic:              Appears normal         Spine:                  Appears normal
Heart:                 Appears normal         Upper Extremities:      Appears normal
(4CH, axis, and situs
RVOT:                  Appears normal         Lower Extremities:      Appears normal
LVOT:                  Appears normal

Other:  Male gender. Heels and 5th digit visualized.
Cervix Uterus Adnexa

Cervix
Length:            2.7  cm.
Measured transvaginally.

Uterus
No abnormality visualized.
Impression

Singleton intrauterine pregnancy at 22 weeks 4 days
gestation with fetal cardiac activity
Breech presentation
Normal detailed fetal anatomy
Normal appearing fetal growth (54th % tile) and amniotic fluid
volume
Anterior placenta without evidence of previa
EV views of cervix: mildly shortened cervical length
measuring 2.7 - 3.0 cms
Recommendations

Recommend follow-up ultrasound examination in 2 weeks for
CL

## 2019-01-18 MED ORDER — ONDANSETRON HCL 4 MG PO TABS: 4.0000 mg | ORAL_TABLET | Freq: Three times a day (TID) | ORAL | 0 refills | Status: DC | PRN

## 2019-01-18 NOTE — Telephone Encounter (Signed)
Pt called stating the flagyl was making her stomach upset. Requesting nausea medicine. Per Dr. Milus Glazier, okay to send in zofran. Pt given note for the weekend due to vomiting.

## 2019-02-02 ENCOUNTER — Telehealth: Payer: Self-pay | Admitting: *Deleted

## 2019-02-02 NOTE — Telephone Encounter (Signed)
-----   Message from Tommye Standard, RN sent at 02/02/2019 11:21 AM EDT ----- Regarding: Advice Pt called and left a voicemail stating that she was experiencing symptoms that she feels could be related to her nexplanon insertion. Pt also wants to discuss chances of being pregnant.   Loma Sousa, RN

## 2019-02-02 NOTE — Telephone Encounter (Signed)
Attempt to reach pt and discuss Nexplanon. No answer, LM On VM to call office as needed.
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
## 2019-05-25 ENCOUNTER — Other Ambulatory Visit: Payer: Self-pay

## 2019-05-25 ENCOUNTER — Ambulatory Visit (INDEPENDENT_AMBULATORY_CARE_PROVIDER_SITE_OTHER)
Admission: RE | Admit: 2019-05-25 | Discharge: 2019-05-25 | Disposition: A | Discharge status: Home / Self Care | Payer: Self-pay | Source: Ambulatory Visit

## 2019-05-25 ENCOUNTER — Ambulatory Visit
Admission: EM | Admit: 2019-05-25 | Discharge: 2019-05-25 | Disposition: A | Discharge status: Home / Self Care | Payer: Self-pay | Attending: Emergency Medicine | Admitting: Emergency Medicine

## 2019-05-25 DIAGNOSIS — N898 Other specified noninflammatory disorders of vagina: Secondary | ICD-10-CM

## 2019-05-25 DIAGNOSIS — R350 Frequency of micturition: Secondary | ICD-10-CM

## 2019-05-25 DIAGNOSIS — R1030 Lower abdominal pain, unspecified: Secondary | ICD-10-CM

## 2019-05-25 DIAGNOSIS — N390 Urinary tract infection, site not specified: Secondary | ICD-10-CM

## 2019-05-25 LAB — POCT URINALYSIS DIP (MANUAL ENTRY)
Bilirubin, UA: NEGATIVE
Glucose, UA: NEGATIVE mg/dL
Ketones, POC UA: NEGATIVE mg/dL
Nitrite, UA: NEGATIVE
Protein Ur, POC: 30 mg/dL — AB
Spec Grav, UA: 1.025 (ref 1.010–1.025)
Urobilinogen, UA: 1 E.U./dL
pH, UA: 7 (ref 5.0–8.0)

## 2019-05-25 LAB — POCT URINE PREGNANCY: Preg Test, Ur: NEGATIVE

## 2019-05-25 MED ORDER — FLUCONAZOLE 200 MG PO TABS: 200.0000 mg | ORAL_TABLET | Freq: Once | ORAL | 0 refills | Status: AC

## 2019-05-25 MED ORDER — CEPHALEXIN 500 MG PO CAPS: 500.0000 mg | ORAL_CAPSULE | Freq: Two times a day (BID) | ORAL | 0 refills | Status: AC

## 2019-05-25 NOTE — ED Triage Notes (Signed)
Pt c/o lower abdominal pain and bladder pain after urinating. Burning and frequency on urination x3 days

## 2019-05-25 NOTE — ED Provider Notes (Signed)
[facility]    CSN: [id number] Arrival date & time: 05/25/19  1451      History   Chief Complaint Chief Complaint  Patient presents with  . Urinary Tract Infection    HPI [name] Schaefer is a 23 y.o. female with history of UTI, STD presenting for lower abdominal pain, discomfort after urination and dysuria for the last 3 days.  Patient presenting to clinic at this provider's request from previous virtual visit earlier today (please see 10/6 virtual visit note). Per HPI from earlier today: "Patient has history of STI, BV: Treated on 5/9 for G/C, 5/11 for BV.  Currently sexually active, has an implant in place.  Has tried Tylenol for symptoms without improvement.  Patient also endorsing urinary frequency without burning, urgency.  States that she has had these in the past, last treated in April per patient." Patient states she abstain from intercourse for 1 week after STD treatment.  States discharge does not feel quite like her normal BV.  No known STD contacts, though patient does have unprotected intercourse.  Patient also has history of dysmenorrhea: No OB/GYN, PCP - Patient looking for care.  Denies history of anemia, lightheadedness, chest pain, weakness.  Past Medical History:  Diagnosis Date  . Anxiety   . Depression     Patient Active Problem List   Diagnosis Date Noted  . SVD (spontaneous vaginal delivery) 09/17/2017  . 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 Supervision of high risk pregnancy, antepartum 05/20/2017    Past Surgical History:  Procedure Laterality Date  . INDUCED ABORTION      OB History    Gravida  5   Para  2   Term  2   Preterm      AB  3   Living  2     SAB  1   TAB  2   Ectopic      Multiple  0   Live Births  2            Home Medications    Prior to Admission medications   Medication Sig Start Date End Date Taking? Authorizing Provider  cephALEXin (KEFLEX) 500 MG capsule Take 1 capsule (500 mg total) by  mouth 2 (two) times daily for 3 days. 05/25/19 05/28/19  Hall-Potvin, [place], PA-C  Etonogestrel (NEXPLANON [place]) Inject into the skin.    [provider]  fluconazole (DIFLUCAN) 200 MG tablet Take 1 tablet (200 mg total) by mouth once for 1 dose. May repeat in 72 hours if needed 05/25/19 05/25/19  Hall-Potvin, [place], PA-C    Family History Family History  Problem Relation Age of Onset  . Hypertension Father   . Diabetes Father   . Diabetes Paternal Grandfather   . Stroke Paternal Grandfather   . Hypertension Paternal Grandfather   . Diabetes Maternal Grandmother   . Hypertension Maternal Grandmother   . Diabetes Maternal Grandfather   . Hypertension Maternal Grandfather   . Diabetes Paternal Grandmother   . Hypertension Paternal Grandmother     Social History Social History   Tobacco Use  . Smoking status: Never Smoker  . Smokeless tobacco: Never Used  Substance Use Topics  . Alcohol use: No  . Drug use: No    Comment: Reports not drinking or using any drugs     Allergies   Patient has no known allergies.   Review of Systems Review of Systems  Constitutional: Negative for fatigue and fever.  HENT: Negative for ear pain,  sinus pain, sore throat and voice change.   Eyes: Negative for pain, redness and visual disturbance.  Respiratory: Negative for cough and shortness of breath.   Cardiovascular: Negative for chest pain and palpitations.  Gastrointestinal: Positive for abdominal pain. Negative for constipation, diarrhea and vomiting.  Genitourinary: Positive for dysuria, frequency, urgency and vaginal discharge. Negative for flank pain, hematuria, pelvic pain, vaginal bleeding and vaginal pain.  Musculoskeletal: Negative for arthralgias and myalgias.  Skin: Negative for rash and wound.  Neurological: Negative for syncope and headaches.     Physical Exam Triage Vital Signs ED Triage Vitals  Enc Vitals Group     BP 05/25/19 1506 111/62     Pulse Rate  05/25/19 1506 72     Resp 05/25/19 1506 18     Temp 05/25/19 1506 98.3 F (36.8 C)     Temp Source 05/25/19 1506 Oral     SpO2 05/25/19 1506 99 %     Weight --      Height --      Head Circumference --      Peak Flow --      Pain Score 05/25/19 1507 8     Pain Loc --      Pain Edu? --      Excl. in [place]? --    No data found.  Updated Vital Signs BP 111/62 (BP Location: Left Arm)   Pulse 72   Temp 98.3 F (36.8 C) (Oral)   Resp 18   SpO2 99%    Physical Exam Constitutional:      General: She is not in acute distress. HENT:     Head: Normocephalic and atraumatic.  Eyes:     General: No scleral icterus.    Conjunctiva/sclera: Conjunctivae normal.     Pupils: Pupils are equal, round, and reactive to light.  Cardiovascular:     Rate and Rhythm: Normal rate and regular rhythm.     Heart sounds: No murmur. No gallop.   Pulmonary:     Effort: Pulmonary effort is normal. No respiratory distress.     Breath sounds: No wheezing or rales.  Abdominal:     General: Bowel sounds are normal.     Palpations: Abdomen is soft.     Tenderness: There is no abdominal tenderness. There is no right CVA tenderness, left CVA tenderness or guarding.  Genitourinary:    Comments: Patient declined, self-swab performed Skin:    Coloration: Skin is not jaundiced or pale.  Neurological:     Mental Status: She is alert and oriented to person, place, and time.      UC Treatments / Results  Labs (all labs ordered are listed, but only abnormal results are displayed) Labs Reviewed  POCT URINALYSIS DIP (MANUAL ENTRY) - Abnormal; Notable for the following components:      Result Value   Clarity, UA cloudy (*)    Blood, UA large (*)    Protein Ur, POC =30 (*)    Leukocytes, UA Small (1+) (*)    All other components within normal limits  URINE CULTURE  POCT URINE PREGNANCY  CERVICOVAGINAL ANCILLARY ONLY    EKG   Radiology No results found.  Procedures Procedures (including critical  care time)  Medications Ordered in UC Medications - No data to display  Initial Impression / Assessment and Plan / UC Course  I have reviewed the triage vital signs and the nursing notes.  Pertinent labs & imaging results that were available during my care  of the patient were reviewed by me and considered in my medical decision making (see chart for details).     POCT urine pregnancy and dipstick done in office, reviewed by me: Negative pregnancy, dipstick positive for blood, protein, leukocytes.  Culture pending.  Given patient is symptomatic, will initiate 3-day course of Keflex.  Patient unsure if she gets yeast infections s/p antibiotic use, though endorsing some vaginal irritation at present.  Diflucan prescribed for patient to take today, end of antibiotic course.  STD panel pending.  Patient to work on Field seismologistvaginal hygiene as outlined below.  Will establish care with PCP, OB/GYN for further dysmenorrhea management.  Return precautions discussed, patient verbalized understanding and is agreeable to plan. Final Clinical Impressions(s) / UC Diagnoses   Final diagnoses:  Vaginal discharge  UTI (urinary tract infection), uncomplicated     Discharge Instructions     Take antibiotic as directed. May take Diflucan emergency use medication) almost daily antibiotic, repeat in 3 days if still having vaginal irritation. We will call you with results of additional testing if positive, and treat if indicated. Important to avoid douching, smelly perfumes, detergents.  Wearing condoms, more frequent tampon/pad changing during cycle can also decrease frequency of BV flares. Return for worsening pain, vaginal pelvic pain, fever, nausea, vomiting.   ED Prescriptions    Medication Sig Dispense Auth. Provider   cephALEXin (KEFLEX) 500 MG capsule Take 1 capsule (500 mg total) by mouth 2 (two) times daily for 3 days. 6 capsule Hall-Potvin, [place], PA-C   fluconazole (DIFLUCAN) 200 MG tablet Take 1  tablet (200 mg total) by mouth once for 1 dose. May repeat in 72 hours if needed 2 tablet Hall-Potvin, [place], PA-C     PDMP not reviewed this encounter.   Hall-Potvin, [place], [place]-C 05/25/19 2124

## 2019-05-25 NOTE — Discharge Instructions (Addendum)
Recommend you evaluated in person for additional testing, management.

## 2019-05-25 NOTE — ED Provider Notes (Signed)
Virtual Visit via Video Note:  [name] Schaefer  initiated request for Telemedicine visit with [facility] team. I connected with [name] Schaefer  on 50/10/5463 at 1:12 PM  for a synchronized telemedicine visit using a video enabled HIPPA compliant telemedicine application. I verified that I am speaking with [name] Schaefer  using two identifiers. [name] Hall-Potvin, PA-C  was physically located in a [facility]gent care site and [name] Schaefer was located at a different location.   The limitations of evaluation and management by telemedicine as well as the availability of in-person appointments were discussed. Patient was informed that she  may incur a bill ( including co-pay) for this virtual visit encounter. [name] Schaefer  expressed understanding and gave verbal consent to proceed with virtual visit.     History of Present Illness:[name] Schaefer  is a 23 y.o. female presents with 3-day course of lower abdominal pain, vaginal discharge, vaginal bleeding, urinary frequency.  Patient has history of STI, BV: Treated on 5/9 for G/C, 5/11 for BV.  Currently sexually active, has an implant in place.  Has tried Tylenol for symptoms without improvement.  Patient also endorsing urinary frequency without burning, urgency.  States that she has had these in the past, last treated in April per patient.    Past Medical History:  Diagnosis Date  . Anxiety   . Depression     No Known Allergies     Observations/Objective: 23 year old female Sitting in no acute distress.  Patient is able to speak in full sentences without coughing, sneezing, wheezing.  Assessment and Plan: 1.  Lower abdominal pain, vaginal discharge, vaginal bleeding Recommended patient come in person for evaluation/diagnostics.  Patient agreeable to plan.  Follow Up Instructions: Patient to be seen at [facility] for further evaluation.   I discussed the assessment and treatment plan with the patient. The  patient was provided an opportunity to ask questions and all were answered. The patient agreed with the plan and demonstrated an understanding of the instructions.   The patient was advised to call back or seek an in-person evaluation if the symptoms worsen or if the condition fails to improve as anticipated.  I provided 10 minutes of non-face-to-face time during this encounter.    [name] Hall-Potvin, PA-C  05/25/2019 1:12 PM        Schaefer, [place], PA-C 05/25/19 1312

## 2019-05-25 NOTE — Discharge Instructions (Addendum)
Take antibiotic as directed. May take [place] emergency use medication) almost daily antibiotic, repeat in 3 days if still having vaginal irritation. We will call you with results of additional testing if positive, and treat if indicated. Important to avoid douching, smelly perfumes, detergents.  Wearing condoms, more frequent tampon/pad changing during cycle can also decrease frequency of BV flares. Return for worsening pain, vaginal pelvic pain, fever, nausea, vomiting.

## 2019-05-27 LAB — URINE CULTURE: Culture: >=100000 — AB

## 2019-05-28 ENCOUNTER — Telehealth (HOSPITAL_COMMUNITY): Payer: Self-pay | Admitting: Emergency Medicine

## 2019-05-28 MED ORDER — CEPHALEXIN 500 MG PO CAPS: 500.0000 mg | ORAL_CAPSULE | Freq: Two times a day (BID) | ORAL | 0 refills | Status: AC

## 2019-05-28 NOTE — Telephone Encounter (Signed)
Urine culture was positive for KLEBSIELLA PNEUMONIAE and was given  keflex at urgent care visit. Pt contacted and made aware, educated on completing antibiotic and to follow up if symptoms are persistent. Verbalized understanding.   Per Dr. Alphonzo Cruise, pt needs another 4 days worth of medicine. Patient contacted and made aware of    results, all questions answered

## 2019-06-02 LAB — CERVICOVAGINAL ANCILLARY ONLY
Bacterial vaginitis: POSITIVE — AB
Candida vaginitis: NEGATIVE
Chlamydia: NEGATIVE
Neisseria Gonorrhea: NEGATIVE
Trichomonas: NEGATIVE

## 2019-06-03 ENCOUNTER — Telehealth (HOSPITAL_COMMUNITY): Payer: Self-pay | Admitting: Emergency Medicine

## 2019-06-03 MED ORDER — METRONIDAZOLE 500 MG PO TABS: 500.0000 mg | ORAL_TABLET | Freq: Two times a day (BID) | ORAL | 0 refills | Status: AC

## 2019-06-03 NOTE — Telephone Encounter (Signed)
Bacterial vaginosis is positive. This was not treated at the urgent care visit.  Flagyl 500 mg BID x 7 days #14 no refills sent to patients pharmacy of choice.    Patient contacted and made aware of    results, all questions answered   

## 2019-09-02 ENCOUNTER — Telehealth: Payer: Self-pay | Admitting: Physician Assistant

## 2019-09-02 DIAGNOSIS — N898 Other specified noninflammatory disorders of vagina: Secondary | ICD-10-CM

## 2019-09-02 MED ORDER — METRONIDAZOLE 500 MG PO TABS: 500.0000 mg | ORAL_TABLET | Freq: Two times a day (BID) | ORAL | 0 refills | Status: AC

## 2019-09-02 NOTE — Progress Notes (Signed)

## 2019-11-10 ENCOUNTER — Telehealth: Payer: Self-pay | Admitting: Family

## 2019-11-10 DIAGNOSIS — N898 Other specified noninflammatory disorders of vagina: Secondary | ICD-10-CM

## 2019-11-10 NOTE — Progress Notes (Signed)
Based on what you shared with me, I feel your condition warrants further evaluation and I recommend that you be seen for a face to face office visit.  Given you are having recurrent vaginal discharge and all of your symptoms you need to be seen face to face to rule out a more serious infection.    NOTE: If you entered your credit card information for this eVisit, you will not be charged. You may see a "hold" on your card for the $35 but that hold will drop off and you will not have a charge processed.   If you are having a true medical emergency please call 911.      For an urgent face to face visit, [place] has five urgent care centers for your convenience:      NEW:  [facility] at [facility] Directions [phone] [address] [place], [place] [address] . 10 am - 6pm Monday - Friday    [facility] Get Driving Directions [phone] [address] [place], [place] [address] . 10 am to 8 pm Monday-Friday . 12 pm to 8 pm [facility] Urgent Care at [facility] Get Driving Directions [phone] [address] [place], [place] [address] . 8 am to 8 pm Monday-Friday . 9 am to 6 pm Saturday . 11 am to 6 pm Sunday     [facility] Get Driving Directions  [phone] [address] [place], [place] [address] . 8 am to 8 pm Monday-Friday . 8 am to 4 pm [facility] Urgent Care at [facility] Directions [phone] [address] [place], [place] [address] . 12 pm to 6 pm Monday-Friday      Your e-visit answers were reviewed by a board certified advanced clinical practitioner to complete your personal care plan.  Thank you for using e-Visits.

## 2019-11-17 ENCOUNTER — Telehealth: Payer: Self-pay | Admitting: Family

## 2019-11-17 DIAGNOSIS — B373 Candidiasis of vulva and vagina: Secondary | ICD-10-CM

## 2019-11-17 DIAGNOSIS — B3731 Acute candidiasis of vulva and vagina: Secondary | ICD-10-CM

## 2019-11-17 MED ORDER — FLUCONAZOLE 150 MG PO TABS: 150.0000 mg | ORAL_TABLET | ORAL | 0 refills | Status: DC | PRN

## 2019-11-17 NOTE — Progress Notes (Signed)
We are sorry that you are not feeling well. Here is how we plan to help!  I will go ahead and treat since you are not having any vaginal discharge and only itching. If your symptoms return or do not improve you will need to be seen face to face.    Based on what you shared with me it looks like you: May have a yeast vaginosis  Vaginosis is an inflammation of the vagina that can result in discharge, itching and pain. The cause is usually a change in the normal balance of vaginal bacteria or an infection. Vaginosis can also result from reduced estrogen levels after menopause.  The most common causes of vaginosis are:   Bacterial vaginosis which results from an overgrowth of one on several organisms that are normally present in your vagina.   Yeast infections which are caused by a naturally occurring fungus called candida.   Vaginal atrophy (atrophic vaginosis) which results from the thinning of the vagina from reduced estrogen levels after menopause.   Trichomoniasis which is caused by a parasite and is commonly transmitted by sexual intercourse.  Factors that increase your risk of developing vaginosis include: [place] Medications, such as antibiotics and steroids . Uncontrolled diabetes . Use of hygiene products such as bubble bath, vaginal spray or vaginal deodorant . Douching . Wearing damp or tight-fitting clothing . Using an intrauterine device (IUD) for birth control . Hormonal changes, such as those associated with pregnancy, birth control pills or menopause . Sexual activity . Having a sexually transmitted infection  Your treatment plan is A single Diflucan (fluconazole) 150mg  tablet once.  I have electronically sent this prescription into the pharmacy that you have chosen.  Be sure to take all of the medication as directed. Stop taking any medication if you develop a rash, tongue swelling or shortness of breath. Mothers who are breast feeding should consider pumping and discarding  their breast milk while on these antibiotics. However, there is no consensus that infant exposure at these doses would be harmful.  Remember that medication creams can weaken latex condoms.   HOME CARE:  Good hygiene may prevent some types of vaginosis from recurring and may relieve some symptoms:  . Avoid baths, hot tubs and whirlpool spas. Rinse soap from your outer genital area after a shower, and dry the area well to prevent irritation. Don't use scented or harsh soaps, such as those with deodorant or antibacterial action. [place] Avoid irritants. These include scented tampons and pads. . Wipe from front to back after using the toilet. Doing so avoids spreading fecal bacteria to your vagina.  Other things that may help prevent vaginosis include:  [place] Kitchen Don't douche. Your vagina doesn't require cleansing other than normal bathing. Repetitive douching disrupts the normal organisms that reside in the vagina and can actually increase your risk of vaginal infection. Douching won't clear up a vaginal infection. . Use a latex condom. Both female and female latex condoms may help you avoid infections spread by sexual contact. . Wear cotton underwear. Also wear pantyhose with a cotton crotch. If you feel comfortable without it, skip wearing underwear to bed. Yeast thrives in [place] Your symptoms should improve in the next day or two.  GET HELP RIGHT AWAY IF:  . You have pain in your lower abdomen ( pelvic area or over your ovaries) . You develop nausea or vomiting . You develop a fever . Your discharge changes or worsens . You have persistent pain with intercourse .  You develop shortness of breath, a rapid pulse, or you faint.  These symptoms could be signs of problems or infections that need to be evaluated by a medical provider now.  MAKE SURE YOU    Understand these instructions.  Will watch your condition.  Will get help right away if you are not doing well or get  worse.  Your e-visit answers were reviewed by a board certified advanced clinical practitioner to complete your personal care plan. Depending upon the condition, your plan could have included both over the counter or prescription medications. Please review your pharmacy choice to make sure that you have choses a pharmacy that is open for you to pick up any needed prescription, Your safety is important to [place]. If you have drug allergies check your prescription carefully.   You can use MyChart to ask questions about today's visit, request a non-urgent call back, or ask for a work or school excuse for 24 hours related to this e-Visit. If it has been greater than 24 hours you will need to follow up with your provider, or enter a new e-Visit to address those concerns. You will get a MyChart message within the next two days asking about your experience. I hope that your e-visit has been valuable and will speed your recovery.  Approximately 5 minutes was spent documenting and reviewing patient's chart.

## 2019-11-18 ENCOUNTER — Other Ambulatory Visit: Payer: Self-pay | Admitting: Family

## 2019-11-27 ENCOUNTER — Telehealth: Payer: Self-pay | Admitting: Nurse Practitioner

## 2019-11-27 DIAGNOSIS — N3000 Acute cystitis without hematuria: Secondary | ICD-10-CM

## 2019-11-27 MED ORDER — NITROFURANTOIN MONOHYD MACRO 100 MG PO CAPS: 100.0000 mg | ORAL_CAPSULE | Freq: Two times a day (BID) | ORAL | 0 refills | Status: DC

## 2019-11-27 NOTE — Progress Notes (Signed)
We are sorry that you are not feeling well.  Here is how we plan to help!  Based on what you shared with me it looks like you most likely have a simple urinary tract infection.  A UTI (Urinary Tract Infection) is a bacterial infection of the bladder.  Most cases of urinary tract infections are simple to treat but a key part of your care is to encourage you to drink plenty of fluids and watch your symptoms carefully.  * frequent UTI require a urologist not OB/GYN. [facility] in [place] is they best. Just make sure you do not need a referral from your primary Care provider. If you do not have a PCP you can call  [phone] and tey can assist you with that - I have prescribed MacroBid 100 mg twice a day for 5 days. Macrobid does not cause a yeast infection , so you should not need a diflucan. Your symptoms should gradually improve. Call us if the burning in your urine worsens, you develop worsening fever, back pain or pelvic pain or if your symptoms do not resolve after completing the antibiotic.  Urinary tract infections can be prevented by drinking plenty of water to keep your body hydrated.  Also be sure when you wipe, wipe from front to back and don't hold it in!  If possible, empty your bladder every 4 hours.  Your e-visit answers were reviewed by a board certified advanced clinical practitioner to complete your personal care plan.  Depending on the condition, your plan could have included both over the counter or prescription medications.  If there is a problem please reply  once you have received a response from your provider.  Your safety is important to [place].  If you have drug allergies check your prescription carefully.    You can use MyChart to ask questions about today's visit, request a non-urgent call back, or ask for a work or school excuse for 24 hours related to this e-Visit. If it has been greater than 24 hours you will need to follow up with your provider, or enter a new  e-Visit to address those concerns.   You will get an e-mail in the next two days asking about your experience.  I hope that your e-visit has been valuable and will speed your recovery. Thank you for using e-visits.   5-10 minutes spent reviewing and documenting in chart.

## 2019-11-27 NOTE — Addendum Note (Signed)
Addended by: Bennie Pierini on: 11/27/2019 06:47 PM   Modules accepted: Orders

## 2020-01-26 ENCOUNTER — Other Ambulatory Visit: Payer: Self-pay

## 2020-01-26 ENCOUNTER — Ambulatory Visit (HOSPITAL_COMMUNITY)
Admission: EM | Admit: 2020-01-26 | Discharge: 2020-01-26 | Disposition: A | Discharge status: Home / Self Care | Payer: Self-pay | Attending: Family Medicine | Admitting: Family Medicine

## 2020-01-26 ENCOUNTER — Encounter (HOSPITAL_COMMUNITY): Payer: Self-pay

## 2020-01-26 DIAGNOSIS — Z113 Encounter for screening for infections with a predominantly sexual mode of transmission: Secondary | ICD-10-CM | POA: Insufficient documentation

## 2020-01-26 DIAGNOSIS — R300 Dysuria: Secondary | ICD-10-CM | POA: Insufficient documentation

## 2020-01-26 DIAGNOSIS — N3090 Cystitis, unspecified without hematuria: Secondary | ICD-10-CM | POA: Insufficient documentation

## 2020-01-26 LAB — POCT URINALYSIS DIP (DEVICE)
Bilirubin Urine: NEGATIVE
Glucose, UA: NEGATIVE mg/dL
Ketones, ur: NEGATIVE mg/dL
Nitrite: NEGATIVE
Protein, ur: NEGATIVE mg/dL
Specific Gravity, Urine: >=1.03 (ref 1.005–1.030)
Urobilinogen, UA: 0.2 mg/dL (ref 0.0–1.0)
pH: 5.5 (ref 5.0–8.0)

## 2020-01-26 MED ORDER — CEPHALEXIN 500 MG PO CAPS
500.0000 mg | ORAL_CAPSULE | Freq: Two times a day (BID) | ORAL | 0 refills | Status: DC
Start: 2020-01-26 — End: 2021-09-13

## 2020-01-26 NOTE — ED Provider Notes (Addendum)
[facility]    ASSESSMENT & PLAN:  1. Dysuria   2. Cystitis   3. Screen for STD (sexually transmitted disease)      Meds ordered this encounter  Medications  . cephALEXin (KEFLEX) 500 MG capsule    Sig: Take 1 capsule (500 mg total) by mouth 2 (two) times daily.    Dispense:  10 capsule    Refill:  0   Vaginal cytology pending.   No signs of pyelonephritis. Discussed. Urine culture sent. Will notify patient of any significant results. Will follow up with her PCP or here if not showing improvement over the next 48 hours, sooner if needed.  She reports frequent UTIs. Recommend: Follow-up Information    Schedule an appointment as soon as possible for a visit  with [facility].   Contact information: [address] [place] [address] [phone]          Outlined signs and symptoms indicating need for more acute intervention. Patient verbalized understanding. After Visit Summary given.  SUBJECTIVE:  [name] Schaefer is a 24 y.o. female who complains of urinary frequency, urgency and dysuria for the past 1-2 weeks. Without associated flank pain, fever, chills, vaginal discharge or bleeding. Gross hematuria: present at times "but not much". No specific aggravating or alleviating factors reported. No LE edema. Normal PO intake without n/v/d. Without specific abdominal pain. Ambulatory without difficulty. OTC treatment: none.  LMP: No LMP recorded (within weeks).  Also requests STD screening. No vaginal discharge.  OBJECTIVE:  Vitals:   01/26/20 1017  BP: (!) 117/91  Pulse: 69  Resp: 17  Temp: 98.2 F (36.8 C)  TempSrc: Oral  SpO2: 99%   General appearance: alert; no distress Lungs: unlabored respirations Abdomen: soft Back: no CVA tenderness reported Extremities: no edema; symmetrical with no gross deformities Skin: warm and dry Neurologic: normal gait Psychological: alert and cooperative; normal mood and  affect  Labs Reviewed  POCT URINALYSIS DIP (DEVICE) - Abnormal; Notable for the following components:      Result Value   Hgb urine dipstick LARGE (*)    Leukocytes,Ua SMALL (*)    All other components within normal limits  URINE CULTURE  CERVICOVAGINAL ANCILLARY ONLY    No Known Allergies  Past Medical History:  Diagnosis Date  . Anxiety   . Depression    Social History   Socioeconomic History  . Marital status: Single    Spouse name: Not on file  . Number of children: Not on file  . Years of education: Not on file  . Highest education level: Not on file  Occupational History  . Not on file  Tobacco Use  . Smoking status: Never Smoker  . Smokeless tobacco: Never Used  Substance and Sexual Activity  . Alcohol use: No  . Drug use: No    Comment: Reports not drinking or using any drugs  . Sexual activity: Yes    Birth control/protection: Implant  Other Topics Concern  . Not on file  Social History Narrative  . Not on file   Social Determinants of Health   Financial Resource Strain:   . Difficulty of Paying Living Expenses:   Food Insecurity:   . Worried About Programme researcher, broadcasting/film/video in the Last Year:   . Barista in the Last Year:   Transportation Needs:   . Freight forwarder (Medical):   Marland Kitchen Lack of Transportation (Non-Medical):   Physical Activity:   .  Days of Exercise per Week:   . Minutes of Exercise per Session:   Stress:   . Feeling of Stress :   Social Connections:   . Frequency of Communication with Friends and Family:   . Frequency of Social Gatherings with Friends and Family:   . Attends Religious Services:   . Active Member of Clubs or Organizations:   . Attends Archivist Meetings:   Marland Kitchen Marital Status:   Intimate Partner Violence:   . Fear of Current or Ex-Partner:   . Emotionally Abused:   Marland Kitchen Physically Abused:   . Sexually Abused:    Family History  Problem Relation Age of Onset  . Hypertension Father   . Diabetes  Father   . Diabetes Paternal Grandfather   . Stroke Paternal Grandfather   . Hypertension Paternal Grandfather   . Diabetes Maternal Grandmother   . Hypertension Maternal Grandmother   . Diabetes Maternal Grandfather   . Hypertension Maternal Grandfather   . Diabetes Paternal Grandmother   . Hypertension Paternal Izetta Dakin, MD 01/26/20 1134    Vanessa Kick, MD 01/26/20 1135

## 2020-01-26 NOTE — ED Triage Notes (Signed)
Pt reports burning when urinating and some blood spots in the urine x 2 weeks.Pt have not try any medications.

## 2020-01-26 NOTE — Discharge Instructions (Addendum)
We have sent testing for sexually transmitted infections. We will notify you of any positive results once they are received. If required, we will prescribe any medications you might need.  Please refrain from all sexual activity for at least the next seven days.  

## 2020-01-27 ENCOUNTER — Telehealth (HOSPITAL_COMMUNITY): Payer: Self-pay | Admitting: Orthopedic Surgery

## 2020-01-27 LAB — CERVICOVAGINAL ANCILLARY ONLY
Bacterial Vaginitis (gardnerella): POSITIVE — AB
Candida Glabrata: NEGATIVE
Candida Vaginitis: NEGATIVE
Chlamydia: NEGATIVE
Comment: NEGATIVE
Comment: NEGATIVE
Comment: NEGATIVE
Comment: NEGATIVE
Comment: NEGATIVE
Comment: NORMAL
Neisseria Gonorrhea: NEGATIVE
Trichomonas: POSITIVE — AB

## 2020-01-27 LAB — URINE CULTURE

## 2020-01-27 MED ORDER — METRONIDAZOLE 500 MG PO TABS
500.0000 mg | ORAL_TABLET | Freq: Two times a day (BID) | ORAL | 0 refills | Status: DC
Start: 2020-01-27 — End: 2020-08-18

## 2020-06-29 ENCOUNTER — Telehealth: Payer: Self-pay | Admitting: Physician Assistant

## 2020-06-29 DIAGNOSIS — R300 Dysuria: Secondary | ICD-10-CM

## 2020-06-29 NOTE — Progress Notes (Signed)
Hi [name] Schaefer,   I am sorry you are not feeling well.  I see the last time you were seen for symptoms of dysuria, you tested positive for Trichomoniasis. The treatment for STDs is completely different than treatment for simple UTI's.   Based on your most recent lab results and that you are currently experiencing vaginal discharge as well, I would feel more comfortable if you were seen in person. I feel your condition warrants further evaluation and I recommend that you be seen for a face to face office visit.    NOTE: If you entered your credit card information for this eVisit, you will not be charged. You may see a "hold" on your card for the $35 but that hold will drop off and you will not have a charge processed.   If you are having a true medical emergency please call 911.      For an urgent face to face visit, [facility] has five urgent care centers for your convenience:     [facility] at [facility] Directions [phone] [address] [place], [place] [address] . 10 am - 6pm Monday - Friday    [facility] Center [facility]) Get Driving Directions [phone] [address] [place], [place] [address] . 10 am to 8 pm Monday-Friday . 12 pm to 8 pm [facility] Urgent Care at [facility] Get Driving Directions [phone] [address] [place], [place] [address] . 8 am to 8 pm Monday-Friday . 9 am to 6 pm Saturday . 11 am to 6 pm Sunday     [facility] Urgent Care at [facility] Get Driving Directions  [phone] [address] [place], [place] [address] . 8 am to 8 pm Monday-Friday . 8 am to 4 pm [facility] Urgent Care at [facility] Directions [phone] [address] [place], [place] [address] . 12 pm to 6 pm Monday-Friday      Your e-visit answers were reviewed by a board certified advanced clinical  practitioner to complete your personal care plan.  Thank you for using e-Visits.

## 2020-08-16 ENCOUNTER — Other Ambulatory Visit: Payer: Self-pay

## 2020-08-16 ENCOUNTER — Ambulatory Visit (HOSPITAL_COMMUNITY)
Admission: EM | Admit: 2020-08-16 | Discharge: 2020-08-16 | Disposition: A | Discharge status: Home / Self Care | Payer: Self-pay | Attending: Family Medicine | Admitting: Family Medicine

## 2020-08-16 ENCOUNTER — Encounter (HOSPITAL_COMMUNITY): Payer: Self-pay

## 2020-08-16 DIAGNOSIS — R350 Frequency of micturition: Secondary | ICD-10-CM | POA: Insufficient documentation

## 2020-08-16 DIAGNOSIS — R300 Dysuria: Secondary | ICD-10-CM | POA: Insufficient documentation

## 2020-08-16 LAB — POCT URINALYSIS DIPSTICK, ED / UC
Bilirubin Urine: NEGATIVE
Glucose, UA: NEGATIVE mg/dL
Ketones, ur: NEGATIVE mg/dL
Nitrite: NEGATIVE
Protein, ur: NEGATIVE mg/dL
Specific Gravity, Urine: 1.025 (ref 1.005–1.030)
Urobilinogen, UA: 1 mg/dL (ref 0.0–1.0)
pH: 6.5 (ref 5.0–8.0)

## 2020-08-16 LAB — POC URINE PREG, ED: Preg Test, Ur: NEGATIVE

## 2020-08-16 MED ORDER — PHENAZOPYRIDINE HCL 200 MG PO TABS
200.0000 mg | ORAL_TABLET | Freq: Three times a day (TID) | ORAL | 0 refills | Status: DC | PRN
Start: 2020-08-16 — End: 2021-11-02

## 2020-08-16 NOTE — ED Provider Notes (Signed)
[facility]   MRN: [id number] DOB: [birth year]  Subjective:   [name] Schaefer is a 24 y.o. female presenting for 1 week history of persistent dysuria, urinary frequency and urgency.  Patient denies fever, nausea, vomiting, belly or pelvic pain, vaginal discharge.  Patient is sexually active with one female partner, does not use condoms for protection.  She would like to make sure she does not have an STI however.  Admits that she does not drink water very much, drinks sodas every day.  No current facility-administered medications for this encounter.  Current Outpatient Medications:    cephALEXin (KEFLEX) 500 MG capsule, Take 1 capsule (500 mg total) by mouth 2 (two) times daily., Disp: 10 capsule, Rfl: 0   Etonogestrel (NEXPLANON [place]), Inject into the skin., Disp: , Rfl:    metroNIDAZOLE (FLAGYL) 500 MG tablet, Take 1 tablet (500 mg total) by mouth 2 (two) times daily., Disp: 14 tablet, Rfl: 0   No Known Allergies  Past Medical History:  Diagnosis Date   Anxiety    Depression      Past Surgical History:  Procedure Laterality Date   INDUCED ABORTION      Family History  Problem Relation Age of Onset   Hypertension Father    Diabetes Father    Diabetes Paternal Grandfather    Stroke Paternal Grandfather    Hypertension Paternal Grandfather    Diabetes Maternal Grandmother    Hypertension Maternal Grandmother    Diabetes Maternal Grandfather    Hypertension Maternal Grandfather    Diabetes Paternal Grandmother    Hypertension Paternal Grandmother     Social History   Tobacco Use   Smoking status: Never Smoker   Smokeless tobacco: Never Used  Substance Use Topics   Alcohol use: No   Drug use: No    Comment: Reports not drinking or using any drugs    ROS   Objective:   Vitals: BP 131/85 (BP Location: Right Arm)    Pulse 78    Temp 99 F (37.2 C) (Oral)    Resp 18    LMP 07/20/2020 (Approximate)    SpO2 98%    Breastfeeding  No   Physical Exam Constitutional:      General: She is not in acute distress.    Appearance: Normal appearance. She is well-developed. She is not ill-appearing.  HENT:     Head: Normocephalic and atraumatic.     Nose: Nose normal.     Mouth/Throat:     Mouth: Mucous membranes are moist.     Pharynx: Oropharynx is clear.  Eyes:     General: No scleral icterus.    Extraocular Movements: Extraocular movements intact.     Pupils: Pupils are equal, round, and reactive to light.  Cardiovascular:     Rate and Rhythm: Normal rate.  Pulmonary:     Effort: Pulmonary effort is normal.  Abdominal:     General: Bowel sounds are normal. There is no distension.     Palpations: Abdomen is soft. There is no mass.     Tenderness: There is no abdominal tenderness. There is no right CVA tenderness, left CVA tenderness, guarding or rebound.  Skin:    General: Skin is warm and dry.  Neurological:     General: No focal deficit present.     Mental Status: She is alert and oriented to person, place, and time.  Psychiatric:        Mood and Affect: Mood normal.  Behavior: Behavior normal.        Thought Content: Thought content normal.        Judgment: Judgment normal.     Results for orders placed or performed during the hospital encounter of 08/16/20 (from the past 24 hour(s))  POC Urinalysis dipstick     Status: Abnormal   Collection Time: 08/16/20 10:49 AM  Result Value Ref Range   Glucose, UA NEGATIVE NEGATIVE mg/dL   Bilirubin Urine NEGATIVE NEGATIVE   Ketones, ur NEGATIVE NEGATIVE mg/dL   Specific Gravity, Urine 1.025 1.005 - 1.030   Hgb urine dipstick TRACE (A) NEGATIVE   pH 6.5 5.0 - 8.0   Protein, ur NEGATIVE NEGATIVE mg/dL   Urobilinogen, UA 1.0 0.0 - 1.0 mg/dL   Nitrite NEGATIVE NEGATIVE   Leukocytes,Ua SMALL (A) NEGATIVE  POC urine pregnancy     Status: None   Collection Time: 08/16/20 10:55 AM  Result Value Ref Range   Preg Test, Ur NEGATIVE NEGATIVE    Assessment  and Plan :   PDMP not reviewed this encounter.  1. Dysuria   2. Urinary frequency     Suspect urinary irritation from not hydrating well and drinking soda on a regular basis.  Recommended that she stop drinking urinary irritants, labs pending.  Offered Pyridium for her urinary symptoms. Counseled patient on potential for adverse effects with medications prescribed/recommended today, ER and return-to-clinic precautions discussed, patient verbalized understanding.    Wallis Bamberg, PA-C 08/16/20 1138

## 2020-08-16 NOTE — ED Triage Notes (Signed)
Pt in with c/o urinary frequency and burning during urination that has been going on for 1 week.  Also requesting STD testing and testing for HSV  Denies any pain, vaginal discharge

## 2020-08-16 NOTE — Discharge Instructions (Signed)

## 2020-08-17 LAB — CERVICOVAGINAL ANCILLARY ONLY
Bacterial Vaginitis (gardnerella): NEGATIVE
Candida Glabrata: NEGATIVE
Candida Vaginitis: NEGATIVE
Chlamydia: NEGATIVE
Comment: NEGATIVE
Comment: NEGATIVE
Comment: NEGATIVE
Comment: NEGATIVE
Comment: NEGATIVE
Comment: NORMAL
Neisseria Gonorrhea: NEGATIVE
Trichomonas: POSITIVE — AB

## 2020-08-17 LAB — URINE CULTURE

## 2020-08-18 ENCOUNTER — Telehealth (HOSPITAL_COMMUNITY): Payer: Self-pay | Admitting: Emergency Medicine

## 2020-08-18 MED ORDER — METRONIDAZOLE 500 MG PO TABS: 500.0000 mg | ORAL_TABLET | Freq: Two times a day (BID) | ORAL | 0 refills | Status: DC

## 2021-01-03 ENCOUNTER — Telehealth: Payer: Self-pay | Admitting: Physician Assistant

## 2021-01-03 DIAGNOSIS — R102 Pelvic and perineal pain: Secondary | ICD-10-CM

## 2021-01-03 DIAGNOSIS — Z202 Contact with and (suspected) exposure to infections with a predominantly sexual mode of transmission: Secondary | ICD-10-CM

## 2021-01-03 DIAGNOSIS — R109 Unspecified abdominal pain: Secondary | ICD-10-CM

## 2021-01-03 NOTE — Progress Notes (Signed)
Based on what you shared with me, I feel your condition warrants further evaluation and I recommend that you be seen in a face to face office visit. We do not assess/treat STI via e-visit or video on demand visit. Giving known history of likely recurrent STI, vaginal pain, back pain and belly pain with other current symptoms, you will need to be seen in person. If you have a PCP or GYN, please call them to get in for evaluation and management. If not, please be seen at a local Urgent care ASAP.    NOTE: If you entered your credit card information for this eVisit, you will not be charged. You may see a "hold" on your card for the $35 but that hold will drop off and you will not have a charge processed.   If you are having a true medical emergency please call 911.      For an urgent face to face visit, [facility] has six urgent care centers for your convenience:     [facility] Center at [facility] Directions [phone] [address] [place], [place] [address] . 8 am - 4 pm Monday - Friday    [facility]) Get Driving Directions [phone] [address] [place], [place] [address] . 8 am to 8 pm Monday-Friday . 10 am to 6 pm [facility] Urgent [facility]) Get Driving Directions [phone]  [address] [place],  [place]  [address] . 8 am to 8 pm Monday-Friday . 8 am to 4 pm [facility] Urgent Care at [facility] Get Driving Directions [phone] [address] [place], [place] [address] . 8 am to 8 pm Monday-Friday . 8 am to 4 pm [facility] Urgent Care at [facility] Get Driving Directions  [phone] [address] [place], [place] [address] . 8 am to 8 pm Monday-Friday . 8 am to 4 pm [facility] Urgent Care at [facility]  Directions [phone] [address] [place], [place] [address] . 8 am to 8 pm Monday-Friday . 8 am to 4 pm Saturday-Sunday     Your MyChart E-visit questionnaire answers were reviewed by a board certified advanced clinical practitioner to complete your personal care plan based on your specific symptoms.  Thank you for using e-Visits.

## 2021-01-23 ENCOUNTER — Encounter (HOSPITAL_COMMUNITY): Payer: Self-pay

## 2021-01-23 ENCOUNTER — Ambulatory Visit (HOSPITAL_COMMUNITY)
Admission: EM | Admit: 2021-01-23 | Discharge: 2021-01-23 | Disposition: A | Discharge status: Home / Self Care | Payer: Self-pay | Attending: Emergency Medicine | Admitting: Emergency Medicine

## 2021-01-23 ENCOUNTER — Other Ambulatory Visit: Payer: Self-pay

## 2021-01-23 DIAGNOSIS — N760 Acute vaginitis: Secondary | ICD-10-CM

## 2021-01-23 LAB — POCT URINALYSIS DIPSTICK, ED / UC
Glucose, UA: NEGATIVE mg/dL
Leukocytes,Ua: NEGATIVE
Nitrite: NEGATIVE
Protein, ur: 30 mg/dL — AB
Specific Gravity, Urine: >=1.03 (ref 1.005–1.030)
Urobilinogen, UA: 1 mg/dL (ref 0.0–1.0)
pH: 6 (ref 5.0–8.0)

## 2021-01-23 LAB — POC URINE PREG, ED: Preg Test, Ur: NEGATIVE

## 2021-01-23 MED ORDER — METRONIDAZOLE 500 MG PO TABS
500.0000 mg | ORAL_TABLET | Freq: Two times a day (BID) | ORAL | 0 refills | Status: DC
Start: 2021-01-23 — End: 2021-09-17

## 2021-01-23 NOTE — ED Triage Notes (Signed)
Pt presents with vaginal discharge X 3 weeks. Pt states she has been having a fish odor when she urinates and states the odor stays through out the day.

## 2021-01-23 NOTE — ED Provider Notes (Signed)
[facility]    CSN: [id number] Arrival date & time: 01/23/21  1530      History   Chief Complaint Chief Complaint  Patient presents with  . Vaginal Discharge    HPI [name] Schaefer is a 25 y.o. female.   Patient presenting today with 3-week history of vaginal discharge and irritation.  Malodorous urine and discharge at times.  States that she recently tested positive for trichomonas, does not think she took her pills right and thinks that it never went away.  Denies new sexual partners, pelvic pain, flank pain, nausea vomiting diarrhea, fevers, vaginal bleeding.  Last menstrual period was 12/22/2020.  Not tried anything over-the-counter for symptoms.     Past Medical History:  Diagnosis Date  . Anxiety   . Depression     Patient Active Problem List   Diagnosis Date Noted  . SVD (spontaneous vaginal delivery) 09/17/2017  . 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 Supervision of high risk pregnancy, antepartum 05/20/2017    Past Surgical History:  Procedure Laterality Date  . INDUCED ABORTION      OB History    Gravida  5   Para  2   Term  2   Preterm      AB  3   Living  2     SAB  1   IAB  2   Ectopic      Multiple  0   Live Births  2            Home Medications    Prior to Admission medications   Medication Sig Start Date End Date Taking? Authorizing Provider  cephALEXin (KEFLEX) 500 MG capsule Take 1 capsule (500 mg total) by mouth 2 (two) times daily. 01/26/20   Mardella Layman, MD  Etonogestrel [facility]) Inject into the skin.    [provider]  metroNIDAZOLE (FLAGYL) 500 MG tablet Take 1 tablet (500 mg total) by mouth 2 (two) times daily. 01/23/21   Particia Nearing, PA-C  phenazopyridine (PYRIDIUM) 200 MG tablet Take 1 tablet (200 mg total) by mouth 3 (three) times daily as needed for pain. 08/16/20   Wallis Bamberg, PA-C    Family History Family History  Problem Relation Age of Onset  . Hypertension Father   .  Diabetes Father   . Diabetes Paternal Grandfather   . Stroke Paternal Grandfather   . Hypertension Paternal Grandfather   . Diabetes Maternal Grandmother   . Hypertension Maternal Grandmother   . Diabetes Maternal Grandfather   . Hypertension Maternal Grandfather   . Diabetes Paternal Grandmother   . Hypertension Paternal Grandmother     Social History Social History   Tobacco Use  . Smoking status: Never Smoker  . Smokeless tobacco: Never Used  Vaping Use  . Vaping Use: Never used  Substance Use Topics  . Alcohol use: No  . Drug use: No    Comment: Reports not drinking or using any drugs     Allergies   Patient has no known allergies.   Review of Systems Review of Systems Per HPI Physical Exam Triage Vital Signs ED Triage Vitals  Enc Vitals Group     BP 01/23/21 1604 (!) 133/94     Pulse Rate 01/23/21 1604 73     Resp 01/23/21 1604 18     Temp 01/23/21 1604 99.6 F (37.6 C)     Temp Source 01/23/21 1604 Oral     SpO2 01/23/21 1604 100 %  Weight --      Height --      Head Circumference --      Peak Flow --      Pain Score 01/23/21 1602 0     Pain Loc --      Pain Edu? --      Excl. in GC? --    No data found.  Updated Vital Signs BP (!) 133/94 (BP Location: Right Arm)   Pulse 73   Temp 99.6 F (37.6 C) (Oral)   Resp 18   LMP 12/22/2020 (Exact Date)   SpO2 100%   Visual Acuity Right Eye Distance:   Left Eye Distance:   Bilateral Distance:    Right Eye Near:   Left Eye Near:    Bilateral Near:     Physical Exam Vitals and nursing note reviewed.  Constitutional:      Appearance: Normal appearance. She is not ill-appearing.  HENT:     Head: Atraumatic.  Eyes:     Extraocular Movements: Extraocular movements intact.     Conjunctiva/sclera: Conjunctivae normal.  Cardiovascular:     Rate and Rhythm: Normal rate and regular rhythm.     Heart sounds: Normal heart sounds.  Pulmonary:     Effort: Pulmonary effort is normal.     Breath  sounds: Normal breath sounds.  Abdominal:     General: Bowel sounds are normal. There is no distension.     Palpations: Abdomen is soft.     Tenderness: There is no abdominal tenderness. There is no right CVA tenderness, left CVA tenderness or guarding.  Genitourinary:    Comments: GU exam deferred, self swab performed Musculoskeletal:        General: Normal range of motion.     Cervical back: Normal range of motion and neck supple.  Skin:    General: Skin is warm and dry.  Neurological:     Mental Status: She is alert and oriented to person, place, and time.  Psychiatric:        Mood and Affect: Mood normal.        Thought Content: Thought content normal.        Judgment: Judgment normal.    UC Treatments / Results  Labs (all labs ordered are listed, but only abnormal results are displayed) Labs Reviewed  POCT URINALYSIS DIPSTICK, ED / UC - Abnormal; Notable for the following components:      Result Value   Bilirubin Urine SMALL (*)    Ketones, ur TRACE (*)    Hgb urine dipstick TRACE (*)    Protein, ur 30 (*)    All other components within normal limits  POC URINE PREG, ED  CERVICOVAGINAL ANCILLARY ONLY    EKG   Radiology No results found.  Procedures Procedures (including critical care time)  Medications Ordered in UC Medications - No data to display  Initial Impression / Assessment and Plan / UC Course  I have reviewed the triage vital signs and the nursing notes.  Pertinent labs & imaging results that were available during my care of the patient were reviewed by me and considered in my medical decision making (see chart for details).     Exam and vitals overall reassuring, vaginal swab pending.  UA without evidence of urinary tract infection and urine pregnant negative.  Will restart Flagyl course as below suspecting trichomoniasis or BV given her symptoms.  Abstinence until results return and at least 7 days after completing treatment for anything she may  be positive for reviewed.  Follow-up with GYN if still not resolving.  Final Clinical Impressions(s) / UC Diagnoses   Final diagnoses:  Acute vaginitis   Discharge Instructions   None    ED Prescriptions    Medication Sig Dispense Auth. Provider   metroNIDAZOLE (FLAGYL) 500 MG tablet Take 1 tablet (500 mg total) by mouth 2 (two) times daily. 14 tablet Particia Nearing, [place]     PDMP not reviewed this encounter.   Particia Nearing, [place] 01/23/21 1658

## 2021-01-24 LAB — CERVICOVAGINAL ANCILLARY ONLY
Bacterial Vaginitis (gardnerella): NEGATIVE
Candida Glabrata: NEGATIVE
Candida Vaginitis: NEGATIVE
Chlamydia: NEGATIVE
Comment: NEGATIVE
Comment: NEGATIVE
Comment: NEGATIVE
Comment: NEGATIVE
Comment: NEGATIVE
Comment: NORMAL
Neisseria Gonorrhea: NEGATIVE
Trichomonas: NEGATIVE

## 2021-02-13 ENCOUNTER — Ambulatory Visit: Payer: Self-pay | Admitting: Obstetrics

## 2021-03-01 ENCOUNTER — Ambulatory Visit: Payer: Self-pay | Admitting: Obstetrics

## 2021-09-13 ENCOUNTER — Encounter (HOSPITAL_COMMUNITY): Payer: Self-pay | Admitting: Emergency Medicine

## 2021-09-13 ENCOUNTER — Other Ambulatory Visit: Payer: Self-pay

## 2021-09-13 ENCOUNTER — Ambulatory Visit (HOSPITAL_COMMUNITY)
Admission: EM | Admit: 2021-09-13 | Discharge: 2021-09-13 | Disposition: A | Discharge status: Home / Self Care | Payer: 59 | Attending: Internal Medicine | Admitting: Internal Medicine

## 2021-09-13 DIAGNOSIS — N760 Acute vaginitis: Secondary | ICD-10-CM | POA: Insufficient documentation

## 2021-09-13 LAB — POCT URINALYSIS DIPSTICK, ED / UC
Bilirubin Urine: NEGATIVE
Glucose, UA: NEGATIVE mg/dL
Leukocytes,Ua: NEGATIVE
Nitrite: NEGATIVE
Protein, ur: NEGATIVE mg/dL
Specific Gravity, Urine: 1.025 (ref 1.005–1.030)
Urobilinogen, UA: 1 mg/dL (ref 0.0–1.0)
pH: 6 (ref 5.0–8.0)

## 2021-09-13 MED ORDER — FLUCONAZOLE 150 MG PO TABS: 150.0000 mg | ORAL_TABLET | Freq: Once | ORAL | 0 refills | Status: AC

## 2021-09-13 NOTE — Discharge Instructions (Addendum)
Please abstain from sexual intercourse until lab results are available We will call you with recommendations if labs are abnormal Return to urgent care if symptoms worsens.

## 2021-09-13 NOTE — ED Triage Notes (Signed)
Reports abd cramping and vaginal discharge that is clumpy. Denies urinary problems.

## 2021-09-13 NOTE — ED Provider Notes (Signed)
[facility]    CSN: [id number] Arrival date & time: 09/13/21  1725      History   Chief Complaint Chief Complaint  Patient presents with   Abdominal Pain   Vaginal Discharge    HPI [name] Schaefer is a 26 y.o. female comes to the urgent care with 3 days history of lower abdominal cramping, whitish thick vaginal discharge.  Vaginal discharge is thick, whitish color and associated with itching.  Patient denies any dysuria urgency frequency.  Her menstrual period just started and is associated with some abdominal cramping.  No nausea or vomiting.  Patient is sexually active and engages in unprotected sexual intercourse.  No deep dyspareunia.  No fever or chills.  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 Authorizing Provider  fluconazole (DIFLUCAN) 150 MG tablet Take 1 tablet (150 mg total) by mouth once for 1 dose. Please take the second dose of fluconazole if no improvement in symptoms in 72 hours. 09/13/21 09/13/21 Yes Rithvik Orcutt, Britta Mccreedy, MD  Etonogestrel [facility]) Inject into the skin.    [provider]  metroNIDAZOLE (FLAGYL) 500 MG tablet Take 1 tablet (500 mg total) by mouth 2 (two) times daily. 01/23/21   Particia Nearing, PA-C  phenazopyridine (PYRIDIUM) 200 MG tablet Take 1 tablet (200 mg total) by mouth 3 (three) times daily as needed for pain. 08/16/20   Wallis Bamberg, PA-C    Family History Family History  Problem Relation Age of Onset   Hypertension Father    Diabetes Father    Diabetes Paternal Grandfather    Stroke Paternal Grandfather    Hypertension Paternal Grandfather    Diabetes Maternal Grandmother    Hypertension Maternal Grandmother    Diabetes Maternal Grandfather    Hypertension Maternal Grandfather    Diabetes Paternal Grandmother    Hypertension Paternal Grandmother     Social History Social History   Tobacco Use   Smoking status: Never   Smokeless tobacco: Never  Vaping Use   Vaping Use: Never used  Substance Use Topics   Alcohol use: No   Drug use: No    Comment: Reports not drinking or using any drugs     Allergies   Patient has no known allergies.   Review of Systems Review of Systems  Respiratory: Negative.    Gastrointestinal:  Positive for abdominal pain. Negative for nausea and vomiting.  Genitourinary:  Positive for vaginal discharge. Negative for dysuria and urgency.  Skin: Negative.     Physical Exam Triage Vital Signs ED Triage Vitals [09/13/21 1808]  Enc Vitals Group  BP 134/76     Pulse Rate 65     Resp 16     Temp 99 F (37.2 C)     Temp Source Oral     SpO2 98 %     Weight      Height      Head Circumference      Peak Flow      Pain Score 8     Pain Loc      Pain Edu?      Excl. in GC?    No data found.  Updated Vital Signs BP 134/76 (BP Location: Left Arm)    Pulse 65    Temp 99 F (37.2 C) (Oral)    Resp 16    SpO2 98%   Visual Acuity Right Eye Distance:   Left Eye Distance:   Bilateral Distance:    Right Eye Near:   Left Eye Near:    Bilateral Near:     Physical Exam Vitals and nursing note reviewed.  Constitutional:      General: She is not in acute distress.    Appearance: She is not ill-appearing.  Cardiovascular:     Rate and Rhythm: Normal rate and regular rhythm.  Abdominal:     General: Bowel sounds are normal.     Palpations: Abdomen is  soft. There is no hepatomegaly or splenomegaly.     Tenderness: There is no abdominal tenderness.  Neurological:     Mental Status: She is alert.     UC Treatments / Results  Labs (all labs ordered are listed, but only abnormal results are displayed) Labs Reviewed  POCT URINALYSIS DIPSTICK, ED / UC - Abnormal; Notable for the following components:      Result Value   Ketones, ur TRACE (*)    Hgb urine dipstick MODERATE (*)    All other components within normal limits  URINE CULTURE  CERVICOVAGINAL ANCILLARY ONLY    EKG   Radiology No results found.  Procedures Procedures (including critical care time)  Medications Ordered in UC Medications - No data to display  Initial Impression / Assessment and Plan / UC Course  I have reviewed the triage vital signs and the nursing notes.  Pertinent labs & imaging results that were available during my care of the patient were reviewed by me and considered in my medical decision making (see chart for details).     1.  Acute vaginitis: This is likely vaginal yeast infection Fluconazole 150 mg x 1 dose to be repeated in 72 hours if symptoms are persistent Cervical vaginal swab for GC/chlamydia/trichomonas/bacterial vaginosis/yeast Return precautions given Safe sex practices advised Abstinence precautions given  Final Clinical Impressions(s) / UC Diagnoses   Final diagnoses:  Acute vaginitis     Discharge Instructions      Please abstain from sexual intercourse until lab results are available We will call you with recommendations if labs are abnormal Return to urgent care if symptoms worsens.   ED Prescriptions     Medication Sig Dispense Auth. Provider   fluconazole (DIFLUCAN) 150 MG tablet Take 1 tablet (150 mg total) by mouth once for 1 dose. Please take the second dose of fluconazole if no improvement in symptoms in 72 hours. 2 tablet Advika Mclelland, Britta Mccreedy, MD      PDMP not reviewed this encounter.   Merrilee Jansky, MD 09/13/21 1911

## 2021-09-14 LAB — CERVICOVAGINAL ANCILLARY ONLY
Bacterial Vaginitis (gardnerella): POSITIVE — AB
Candida Glabrata: NEGATIVE
Candida Vaginitis: NEGATIVE
Chlamydia: NEGATIVE
Comment: NEGATIVE
Comment: NEGATIVE
Comment: NEGATIVE
Comment: NEGATIVE
Comment: NEGATIVE
Comment: NORMAL
Neisseria Gonorrhea: NEGATIVE
Trichomonas: NEGATIVE

## 2021-09-14 LAB — URINE CULTURE

## 2021-09-17 ENCOUNTER — Telehealth (HOSPITAL_COMMUNITY): Payer: Self-pay | Admitting: Emergency Medicine

## 2021-09-17 MED ORDER — METRONIDAZOLE 500 MG PO TABS: 500.0000 mg | ORAL_TABLET | Freq: Two times a day (BID) | ORAL | 0 refills | Status: DC

## 2021-10-18 ENCOUNTER — Telehealth (HOSPITAL_COMMUNITY): Payer: Self-pay

## 2021-10-18 NOTE — Telephone Encounter (Signed)
I called patient regarding question about picking up Flagyl one month after prescription sent to pharmacy.  ? ?Dr. Windy Carina stated pt call pharmacy first to find out if they are able to still fill.  ? ?Patient did not answer.  ?

## 2021-11-02 ENCOUNTER — Ambulatory Visit (HOSPITAL_COMMUNITY)
Admission: EM | Admit: 2021-11-02 | Discharge: 2021-11-02 | Disposition: A | Discharge status: Home / Self Care | Payer: 59 | Attending: Family Medicine | Admitting: Family Medicine

## 2021-11-02 ENCOUNTER — Encounter (HOSPITAL_COMMUNITY): Payer: Self-pay | Admitting: Emergency Medicine

## 2021-11-02 DIAGNOSIS — N898 Other specified noninflammatory disorders of vagina: Secondary | ICD-10-CM | POA: Insufficient documentation

## 2021-11-02 DIAGNOSIS — Z20822 Contact with and (suspected) exposure to covid-19: Secondary | ICD-10-CM | POA: Insufficient documentation

## 2021-11-02 DIAGNOSIS — R059 Cough, unspecified: Secondary | ICD-10-CM | POA: Insufficient documentation

## 2021-11-02 DIAGNOSIS — K047 Periapical abscess without sinus: Secondary | ICD-10-CM

## 2021-11-02 DIAGNOSIS — J069 Acute upper respiratory infection, unspecified: Secondary | ICD-10-CM | POA: Diagnosis not present

## 2021-11-02 DIAGNOSIS — K0889 Other specified disorders of teeth and supporting structures: Secondary | ICD-10-CM

## 2021-11-02 DIAGNOSIS — J029 Acute pharyngitis, unspecified: Secondary | ICD-10-CM | POA: Insufficient documentation

## 2021-11-02 LAB — SARS CORONAVIRUS 2 (TAT 6-24 HRS): SARS Coronavirus 2: NEGATIVE

## 2021-11-02 LAB — POCT RAPID STREP A, ED / UC: Streptococcus, Group A Screen (Direct): NEGATIVE

## 2021-11-02 MED ORDER — METRONIDAZOLE 500 MG PO TABS: 500.0000 mg | ORAL_TABLET | Freq: Two times a day (BID) | ORAL | 0 refills | Status: DC

## 2021-11-02 MED ORDER — AMOXICILLIN 500 MG PO CAPS: 500.0000 mg | ORAL_CAPSULE | Freq: Three times a day (TID) | ORAL | 0 refills | Status: AC

## 2021-11-02 NOTE — Discharge Instructions (Addendum)
You were seen today for various symptoms.  ?I have sent out flagyl to treat BV.  We will await the vaginal swab results to be resulted for further treatment.  This will be resulted tomorrow.  ?As far as your upper respiratory symptoms,  this is likely viral.  Strep is negative.  Covid swab is pending and will be resulted tomorrow.  ?I do think you have a tooth infection.  I have sent out an antibiotic to your pharmacy, three times/day x 10 days.   Please follow up with a dentist as scheduled.  You may take tylenol/motrin for pain.  ?Strep test is negative ?

## 2021-11-02 NOTE — ED Triage Notes (Signed)
Pt reports nasal congestion and cough x 1 week. States she thinks she has a sinus infection.  ?Also reports dental pain x 2 weeks. States wisdom teeth are growing in and face is swollen on the left side.  ?Reports vaginal discharge x 1 month. States she was here in February and dx with BV and did not get the prescription.  ?

## 2021-11-02 NOTE — ED Provider Notes (Signed)
?[facility] ? ? ? ?CSN: [id number] ?Arrival date & time: 11/02/21  1112 ? ? ?  ? ?History   ?Chief Complaint ?Chief Complaint  ?Patient presents with  ? Dental Pain  ? Nasal Congestion  ? Cough  ? ? ?HPI ?[name] Schaefer is a 26 y.o. female.  ? ?Sinus congestion, drainage.  Chest gets tight.  She is coughing.  She did have a temp of 102 3 days ago.   Going on 1 week.  Tired, body aches.  ?+ sore throat.  ?She took otc meds without help.  ?No known sick contacts.  ?Dental pain and swelling on the left;  thinks b/c her wisdom tooth is coming in.  ? ?Here for vaginal d/c.  Dx with BV, but never picked up the script.  D/c that is grayish in color;  + itching, odor.  ?She is sexually active.  No condom use;  1 partner.  ? ?Past Medical History:  ?Diagnosis Date  ? Anxiety   ? Depression   ? ? ?Patient Active Problem List  ? Diagnosis Date Noted  ? SVD (spontaneous vaginal delivery) 09/17/2017  ? 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 Supervision of high risk pregnancy, antepartum 05/20/2017  ? ? ?Past Surgical History:  ?Procedure Laterality Date  ? INDUCED ABORTION    ? ? ?OB History   ? ? Gravida  ?5  ? Para  ?2  ? Term  ?2  ? Preterm  ?   ? AB  ?3  ? Living  ?2  ?  ? ? SAB  ?1  ? IAB  ?2  ? Ectopic  ?   ? Multiple  ?0  ? Live Births  ?2  ?   ?  ?  ? ? ? ?Home Medications   ? ?Prior to Admission medications   ?Medication Sig Start Date End Date Taking? Authorizing Provider  ?Etonogestrel (NEXPLANON [place]) Inject into the skin.    [provider]  ?metroNIDAZOLE (FLAGYL) 500 MG tablet Take 1 tablet (500 mg total) by mouth 2 (two) times daily. 09/17/21   LampteyBritta Mccreedy, MD  ?phenazopyridine (PYRIDIUM) 200 MG tablet Take 1 tablet (200 mg total) by mouth 3 (three) times daily as needed for pain. 08/16/20   Wallis Bamberg, PA-C  ? ? ?Family History ?Family History  ?Problem Relation Age of Onset  ? Hypertension Father   ? Diabetes Father   ? Diabetes Paternal Grandfather   ? Stroke Paternal Grandfather   ?  Hypertension Paternal Grandfather   ? Diabetes Maternal Grandmother   ? Hypertension Maternal Grandmother   ? Diabetes Maternal Grandfather   ? Hypertension Maternal Grandfather   ? Diabetes Paternal Grandmother   ? Hypertension Paternal Grandmother   ? ? ?Social History ?Social History  ? ?Tobacco Use  ? Smoking status: Never  ? Smokeless tobacco: Never  ?Vaping Use  ? Vaping Use: Never used  ?Substance Use Topics  ? Alcohol use: No  ? Drug use: No  ?  Comment: Reports not drinking or using any drugs  ? ? ? ?Allergies   ?Patient has no known allergies. ? ? ?Review of Systems ?Review of Systems  ?Constitutional:  Positive for chills and fever.  ?HENT:  Positive for congestion, dental problem, rhinorrhea and sore throat. Negative for sinus pressure and sinus pain.   ?Respiratory:  Positive for cough.   ?Cardiovascular: Negative.   ?Gastrointestinal: Negative.   ?Genitourinary:  Positive for vaginal discharge.  ? ? ?Physical Exam ?Triage  Vital Signs ?ED Triage Vitals  ?Enc Vitals Group  ?   BP 11/02/21 1133 (!) 142/86  ?   Pulse Rate 11/02/21 1133 85  ?   Resp --   ?   Temp 11/02/21 1133 98.9 ?F (37.2 ?C)  ?   Temp Source 11/02/21 1133 Oral  ?   SpO2 11/02/21 1133 96 %  ?   Weight 11/02/21 1131 130 lb 1.1 oz (59 kg)  ?   Height 11/02/21 1131 5\' 1"  (1.549 m)  ?   Head Circumference --   ?   Peak Flow --   ?   Pain Score 11/02/21 1131 8  ?   Pain Loc --   ?   Pain Edu? --   ?   Excl. in GC? --   ? ?No data found. ? ?Updated Vital Signs ?BP (!) 142/86 (BP Location: Right Arm)   Pulse 85   Temp 98.9 ?F (37.2 ?C) (Oral)   Ht 5\' 1"  (1.549 m)   Wt 59 kg   SpO2 96%   BMI 24.58 kg/m?  ? ?Visual Acuity ?Right Eye Distance:   ?Left Eye Distance:   ?Bilateral Distance:   ? ?Right Eye Near:   ?Left Eye Near:    ?Bilateral Near:    ? ?Physical Exam ?Constitutional:   ?   Appearance: Normal appearance.  ?HENT:  ?   Head: Normocephalic and atraumatic.  ?   Right Ear: Tympanic membrane normal.  ?   Left Ear: Tympanic membrane  normal.  ?   Nose:  ?   Right Sinus: No maxillary sinus tenderness or frontal sinus tenderness.  ?   Left Sinus: Frontal sinus tenderness present. No maxillary sinus tenderness.  ?   Mouth/Throat:  ?   Dentition: Dental tenderness and gum lesions present.  ?   Tonsils: 1+ on the right. 1+ on the left.  ?   Comments: At the left lower molar there is tenderness, erythema, white patches at the gum and posterior pharynx; I don't see a wisdom tooth erupting, but appears to have infection where a tooth should be ?Neck:  ?   Comments: Swelling to the left lower jaw area; tenderness noted;  ?Cardiovascular:  ?   Rate and Rhythm: Normal rate and regular rhythm.  ?Pulmonary:  ?   Effort: Pulmonary effort is normal.  ?   Breath sounds: Normal breath sounds.  ?Musculoskeletal:  ?   Cervical back: Normal range of motion. Tenderness present.  ?Lymphadenopathy:  ?   Cervical: Cervical adenopathy present.  ?Neurological:  ?   Mental Status: She is alert.  ? ? ? ?UC Treatments / Results  ?Labs ?(all labs ordered are listed, but only abnormal results are displayed) ?Labs Reviewed  ?CULTURE, GROUP A STREP [facility])  ?SARS CORONAVIRUS 2 (TAT 6-24 HRS)  ?POCT RAPID STREP A, ED / UC  ?CERVICOVAGINAL ANCILLARY ONLY  ? ? ?EKG ? ? ?Radiology ?No results found. ? ?Procedures ?Procedures (including critical care time) ? ?Medications Ordered in UC ?Medications - No data to display ? ?Initial Impression / Assessment and Plan / UC Course  ?I have reviewed the triage vital signs and the nursing notes. ? ?Pertinent labs & imaging results that were available during my care of the patient were reviewed by me and considered in my medical decision making (see chart for details). ? ?  ? ?Final Clinical Impressions(s) / UC Diagnoses  ? ?Final diagnoses:  ?Pain, dental  ?Viral URI with cough  ?Dental infection  ?  Vaginal discharge  ? ? ? ?Discharge Instructions   ? ?  ?You were seen today for various symptoms.  ?I have sent out flagyl to treat BV.  We will  await the vaginal swab results to be resulted for further treatment.  This will be resulted tomorrow.  ?As far as your upper respiratory symptoms,  this is likely viral.  Strep is negative.  Covid swab is pending and will be resulted tomorrow.  ?I do think you have a tooth infection.  I have sent out an antibiotic to your pharmacy, three times/day x 10 days.   Please follow up with a dentist as scheduled.  ?Strep test is negative ? ? ? ?ED Prescriptions   ? ? Medication Sig Dispense Auth. Provider  ? metroNIDAZOLE (FLAGYL) 500 MG tablet Take 1 tablet (500 mg total) by mouth 2 (two) times daily. 14 tablet Bayani Renteria, MD  ? amoxicillin (AMOXIL) 500 MG capsule Take 1 capsule (500 mg total) by mouth 3 (three) times daily for 10 days. 30 capsule Jannifer Franklin, MD  ? ?  ? ?PDMP not reviewed this encounter. ?  Jannifer Franklin, MD ?11/02/21 1241 ? ?

## 2021-11-03 LAB — CULTURE, GROUP A STREP (THRC)

## 2021-11-05 ENCOUNTER — Telehealth (HOSPITAL_COMMUNITY): Payer: Self-pay | Admitting: Emergency Medicine

## 2021-11-05 LAB — CERVICOVAGINAL ANCILLARY ONLY
Bacterial Vaginitis (gardnerella): POSITIVE — AB
Candida Glabrata: NEGATIVE
Candida Vaginitis: NEGATIVE
Chlamydia: POSITIVE — AB
Comment: NEGATIVE
Comment: NEGATIVE
Comment: NEGATIVE
Comment: NEGATIVE
Comment: NEGATIVE
Comment: NORMAL
Neisseria Gonorrhea: POSITIVE — AB
Trichomonas: NEGATIVE

## 2021-11-05 MED ORDER — DOXYCYCLINE HYCLATE 100 MG PO CAPS: 100.0000 mg | ORAL_CAPSULE | Freq: Two times a day (BID) | ORAL | 0 refills | Status: AC

## 2021-11-05 NOTE — Telephone Encounter (Signed)
Patient treated at time of visit with Metronidazole.   ?Will also need treatment with IM Rocephin 500mg  IM for positive Gonorrhea and Doxycycline ?Attempted to reach patient x 1, VM is full ?Prescription sent to pharmacy on file ?[facility] notified ?

## 2021-11-06 ENCOUNTER — Ambulatory Visit (HOSPITAL_COMMUNITY)
Admission: RE | Admit: 2021-11-06 | Discharge: 2021-11-06 | Disposition: A | Discharge status: Home / Self Care | Payer: 59 | Source: Ambulatory Visit | Attending: Internal Medicine | Admitting: Internal Medicine

## 2021-11-06 ENCOUNTER — Other Ambulatory Visit: Payer: Self-pay

## 2021-11-06 DIAGNOSIS — A549 Gonococcal infection, unspecified: Secondary | ICD-10-CM | POA: Diagnosis not present

## 2021-11-06 MED ORDER — CEFTRIAXONE SODIUM 500 MG IJ SOLR
INTRAMUSCULAR | Status: AC
  Filled 2021-11-06: qty 500

## 2021-11-06 MED ORDER — CEFTRIAXONE SODIUM 500 MG IJ SOLR
500.0000 mg | Freq: Once | INTRAMUSCULAR | Status: AC
  Administered 2021-11-06: 500 mg via INTRAMUSCULAR

## 2021-11-06 MED ORDER — LIDOCAINE HCL (PF) 1 % IJ SOLN
INTRAMUSCULAR | Status: AC
  Filled 2021-11-06: qty 2

## 2021-11-06 NOTE — ED Triage Notes (Signed)
Pt presents with complaints of needing treatment for gonorrhea.  ?

## 2022-07-03 ENCOUNTER — Telehealth: Payer: 59 | Admitting: Physician Assistant

## 2022-07-03 DIAGNOSIS — E860 Dehydration: Secondary | ICD-10-CM

## 2022-07-03 DIAGNOSIS — R112 Nausea with vomiting, unspecified: Secondary | ICD-10-CM

## 2022-07-03 NOTE — Progress Notes (Signed)
Based on what you shared with me, I feel your condition warrants further evaluation as soon as possible at an Emergency department.    NOTE: There will be NO CHARGE for this eVisit   If you are having a true medical emergency please call 911.      Emergency Department-[facility]  Get Driving Directions  [phone]  [address]  [place], [place] [address]  Open 24/7/365      [facility] [address]  Get Driving Directions  [address]  [place], [place] [address]  Open 24/7/365    Emergency Department- [facility]  Get Driving Directions  [phone]  [address]  [place], [place] [address]  Open 24/7/365      [facility]  Get Driving Directions  [phone]  [address]  [place], [place] [address]  Open 24/7/365    [place]  Emergency Department- [facility]  Get Driving Directions  [phone]  [address]  [place], [place] [address]  Open 24/7/365    [place]  Emergency Department- [facility] [place]  Get Driving Directions  [address]  [place], [place] [address]  Open 24/7/365    [place]  Emergency Department- [facility]  Get Driving Directions  [phone]  [address]  , [place] [address]  Open 24/7/365    I have spent 5 minutes in review of e-visit questionnaire, review and updating patient chart, medical decision making and response to patient.   Isaul Landi M Chivas Notz, PA-C  

## 2022-12-20 ENCOUNTER — Encounter (HOSPITAL_COMMUNITY): Payer: Self-pay

## 2022-12-20 ENCOUNTER — Ambulatory Visit (HOSPITAL_COMMUNITY)
Admission: EM | Admit: 2022-12-20 | Discharge: 2022-12-20 | Disposition: A | Discharge status: Home / Self Care | Payer: 59 | Attending: Internal Medicine | Admitting: Internal Medicine

## 2022-12-20 DIAGNOSIS — Z113 Encounter for screening for infections with a predominantly sexual mode of transmission: Secondary | ICD-10-CM | POA: Insufficient documentation

## 2022-12-20 DIAGNOSIS — R300 Dysuria: Secondary | ICD-10-CM | POA: Insufficient documentation

## 2022-12-20 DIAGNOSIS — Z202 Contact with and (suspected) exposure to infections with a predominantly sexual mode of transmission: Secondary | ICD-10-CM | POA: Insufficient documentation

## 2022-12-20 DIAGNOSIS — N898 Other specified noninflammatory disorders of vagina: Secondary | ICD-10-CM

## 2022-12-20 LAB — POCT URINALYSIS DIP (MANUAL ENTRY)
Blood, UA: NEGATIVE
Glucose, UA: NEGATIVE mg/dL
Nitrite, UA: NEGATIVE
Spec Grav, UA: 1.015 (ref 1.010–1.025)
Urobilinogen, UA: 2 E.U./dL — AB
pH, UA: 7.5 (ref 5.0–8.0)

## 2022-12-20 LAB — RPR: RPR Ser Ql: NONREACTIVE

## 2022-12-20 LAB — HIV ANTIBODY (ROUTINE TESTING W REFLEX): HIV Screen 4th Generation wRfx: NONREACTIVE

## 2022-12-20 NOTE — ED Provider Notes (Signed)
[facility]    CSN: [id number] Arrival date & time: 12/20/22  4098      History   Chief Complaint Chief Complaint  Patient presents with   Exposure to STD    HPI [name] Schaefer is a 27 y.o. female.   Patient here for evaluation of vaginal discharge x 1 - 2 weeks.  Thick, yellow discharge.  She reports pan-positive ROS.  Sexually active, 1 female partner, she reports he cheated on her.  She would like STD testing.    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 Authorizing Provider  Etonogestrel (NEXPLANON [place]) Inject into the skin.    [provider]  metroNIDAZOLE (FLAGYL) 500 MG tablet Take 1 tablet (500 mg total) by mouth 2 (two) times daily. 11/02/21   Jannifer Franklin, MD    Family History Family History  Problem Relation Age of Onset   Hypertension Father    Diabetes Father    Diabetes Paternal Grandfather    Stroke Paternal Grandfather    Hypertension Paternal Grandfather    Diabetes Maternal Grandmother    Hypertension Maternal Grandmother    Diabetes Maternal Grandfather    Hypertension Maternal Grandfather    Diabetes Paternal Grandmother    Hypertension Paternal Grandmother     Social History Social History   Tobacco Use   Smoking status: Never   Smokeless tobacco: Never  Vaping Use   Vaping Use: Never used  Substance Use Topics   Alcohol use: No   Drug  use: No    Comment: Reports not drinking or using any drugs     Allergies   Patient has no known allergies.   Review of Systems Review of Systems  Constitutional:  Positive for chills, fatigue and fever.  Gastrointestinal:  Positive for abdominal pain. Negative for diarrhea, nausea and vomiting.  Genitourinary:  Positive for dysuria, flank pain, frequency, hematuria, urgency and vaginal discharge. Negative for decreased urine volume, difficulty urinating, dyspareunia, genital sores, menstrual problem, pelvic pain, vaginal bleeding and vaginal pain.  Musculoskeletal:  Negative for arthralgias, back pain and myalgias.  Skin:  Negative for color change and rash.  Neurological:  Negative for headaches.  Hematological:  Negative for adenopathy. Does not bruise/bleed easily.  Psychiatric/Behavioral:  Negative for sleep disturbance.   All other systems reviewed and are negative.    Physical Exam Triage Vital Signs ED Triage Vitals [12/20/22 0938]  Enc Vitals Group     BP (!) 136/100     Pulse Rate 97     Resp 16     Temp 98.1 F (36.7 C)  Temp Source Oral     SpO2 97 %     Weight      Height      Head Circumference      Peak Flow      Pain Score      Pain Loc      Pain Edu?      Excl. in GC?    No data found.  Updated Vital Signs BP (!) 136/100 (BP Location: Left Arm)   Pulse 97   Temp 98.1 F (36.7 C) (Oral)   Resp 16   SpO2 97%   Visual Acuity Right Eye Distance:   Left Eye Distance:   Bilateral Distance:    Right Eye Near:   Left Eye Near:    Bilateral Near:     Physical Exam Vitals and nursing note reviewed.  Constitutional:      General: She is not in acute distress.    Appearance: Normal appearance. She is well-developed. She is not ill-appearing.  HENT:     Head: Normocephalic and atraumatic.     Nose: Nose normal.  Eyes:     General: No scleral icterus.    Extraocular Movements: Extraocular movements intact.     Conjunctiva/sclera:  Conjunctivae normal.  Pulmonary:     Effort: Pulmonary effort is normal. No respiratory distress.  Abdominal:     General: Bowel sounds are normal.     Palpations: Abdomen is soft.     Tenderness: There is no abdominal tenderness. There is no right CVA tenderness, left CVA tenderness or guarding.  Musculoskeletal:     Cervical back: Normal range of motion and neck supple. No rigidity.  Skin:    General: Skin is warm and dry.  Neurological:     General: No focal deficit present.     Mental Status: She is alert and oriented to person, place, and time.     Motor: No weakness.     Gait: Gait normal.  Psychiatric:        Mood and Affect: Mood normal.        Behavior: Behavior normal.      UC Treatments / Results  Labs (all labs ordered are listed, but only abnormal results are displayed) Labs Reviewed  POCT URINALYSIS DIP (MANUAL ENTRY) - Abnormal; Notable for the following components:      Result Value   Color, UA light yellow (*)    Bilirubin, UA small (*)    Ketones, POC UA moderate (40) (*)    Protein Ur, POC trace (*)    Urobilinogen, UA 2.0 (*)    Leukocytes, UA Trace (*)    All other components within normal limits  URINE CULTURE  HIV ANTIBODY (ROUTINE TESTING W REFLEX)  RPR  CERVICOVAGINAL ANCILLARY ONLY    EKG   Radiology No results found.  Procedures Procedures (including critical care time)  Medications Ordered in UC Medications - No data to display  Initial Impression / Assessment and Plan / UC Course  I have reviewed the triage vital signs and the nursing notes.  Pertinent labs & imaging results that were available during my care of the patient were reviewed by me and considered in my medical decision making (see chart for details).     Will treat based on lab results Return PRN Final Clinical Impressions(s) / UC Diagnoses   Final diagnoses:  Possible exposure to STD  Vaginal discharge  Screen for STD (sexually transmitted disease)  Dysuria      Discharge  Instructions      Labs sent for additional testing. Results available via mychart in 2 - 5 days We will call if medication needed for treatment.     ED Prescriptions   None    PDMP not reviewed this encounter.   Evern Core, PA-C 12/20/22 1030

## 2022-12-20 NOTE — ED Triage Notes (Signed)
Here for STI-testing and blood work. Pt reports vaginal discharge and dysuria.

## 2022-12-20 NOTE — Discharge Instructions (Addendum)
Labs sent for additional testing. Results available via mychart in 2 - 5 days We will call if medication needed for treatment.

## 2022-12-21 LAB — URINE CULTURE
Culture: 40000 — AB
Special Requests: NORMAL

## 2022-12-23 ENCOUNTER — Telehealth (HOSPITAL_COMMUNITY): Payer: Self-pay | Admitting: Emergency Medicine

## 2022-12-23 LAB — CERVICOVAGINAL ANCILLARY ONLY
Bacterial Vaginitis (gardnerella): POSITIVE — AB
Candida Glabrata: NEGATIVE
Candida Vaginitis: POSITIVE — AB
Chlamydia: NEGATIVE
Comment: NEGATIVE
Comment: NEGATIVE
Comment: NEGATIVE
Comment: NEGATIVE
Comment: NEGATIVE
Comment: NORMAL
Neisseria Gonorrhea: NEGATIVE
Trichomonas: NEGATIVE

## 2022-12-23 MED ORDER — METRONIDAZOLE 500 MG PO TABS: 500.0000 mg | ORAL_TABLET | Freq: Two times a day (BID) | ORAL | 0 refills | Status: DC

## 2022-12-23 MED ORDER — FLUCONAZOLE 150 MG PO TABS: 150.0000 mg | ORAL_TABLET | Freq: Once | ORAL | 0 refills | Status: AC

## 2023-03-30 ENCOUNTER — Encounter (HOSPITAL_COMMUNITY): Payer: Self-pay

## 2023-03-30 ENCOUNTER — Ambulatory Visit (HOSPITAL_COMMUNITY)
Admission: EM | Admit: 2023-03-30 | Discharge: 2023-03-30 | Disposition: A | Discharge status: Home / Self Care | Payer: Self-pay | Attending: Family Medicine | Admitting: Family Medicine

## 2023-03-30 DIAGNOSIS — R300 Dysuria: Secondary | ICD-10-CM | POA: Insufficient documentation

## 2023-03-30 LAB — POCT URINALYSIS DIP (MANUAL ENTRY)
Glucose, UA: NEGATIVE mg/dL
Nitrite, UA: NEGATIVE
Protein Ur, POC: 100 mg/dL — AB
Spec Grav, UA: >=1.03 — AB (ref 1.010–1.025)
Urobilinogen, UA: 1 E.U./dL
pH, UA: 6 (ref 5.0–8.0)

## 2023-03-30 LAB — POCT URINE PREGNANCY: Preg Test, Ur: NEGATIVE

## 2023-03-30 MED ORDER — NITROFURANTOIN MONOHYD MACRO 100 MG PO CAPS: 100.0000 mg | ORAL_CAPSULE | Freq: Two times a day (BID) | ORAL | 0 refills | Status: DC

## 2023-03-30 NOTE — ED Provider Notes (Signed)
[facility]    CSN: [id number] Arrival date & time: 03/30/23  1355      History   Chief Complaint No chief complaint on file.   HPI [name] Schaefer is a 27 y.o. female.   HPI Here for pelvic cramping, dysuria and urinary frequency.  She is also had some pelvic pressure.  No fever or chills.  She has had a little nausea but no vomiting  Last menstrual cycle was sometime ago.  She does have a Nexplanon so she has irregular periods.  She found dried dc in her boyfriend's underwear, and so is concerned about STD's.   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 Authorizing Provider  nitrofurantoin, macrocrystal-monohydrate, (MACROBID) 100 MG capsule Take 1 capsule (100 mg total) by mouth 2 (two) times daily. 03/30/23  Yes Mussa Groesbeck, Janace Aris, MD  Etonogestrel [facility]) Inject into the skin.    [provider]    Family History Family History  Problem Relation Age of Onset   Hypertension Father    Diabetes Father    Diabetes Paternal Grandfather    Stroke Paternal Grandfather    Hypertension Paternal Grandfather    Diabetes Maternal Grandmother    Hypertension Maternal Grandmother    Diabetes Maternal Grandfather    Hypertension Maternal Grandfather    Diabetes Paternal Grandmother    Hypertension Paternal Grandmother     Social History Social History   Tobacco Use    Smoking status: Never   Smokeless tobacco: Never  Vaping Use   Vaping status: Never Used  Substance Use Topics   Alcohol use: No   Drug use: No    Comment: Reports not drinking or using any drugs     Allergies   Patient has no known allergies.   Review of Systems Review of Systems   Physical Exam Triage Vital Signs ED Triage Vitals  Encounter Vitals Group     BP 03/30/23 1414 (!) 146/72     Systolic BP Percentile --      Diastolic BP Percentile --      Pulse Rate 03/30/23 1414 82     Resp 03/30/23 1414 14     Temp 03/30/23 1414 98.7 F (37.1 C)     Temp Source 03/30/23 1414 Oral     SpO2 03/30/23 1414 98 %     Weight --      Height --      Head Circumference --      Peak Flow --      Pain Score 03/30/23 1416 8     Pain Loc --  Pain Education --      Exclude from Growth Chart --    No data found.  Updated Vital Signs BP (!) 146/72 (BP Location: Left Arm)   Pulse 82   Temp 98.7 F (37.1 C) (Oral)   Resp 14   SpO2 98%   Visual Acuity Right Eye Distance:   Left Eye Distance:   Bilateral Distance:    Right Eye Near:   Left Eye Near:    Bilateral Near:     Physical Exam Vitals reviewed.  Constitutional:      General: She is not in acute distress.    Appearance: She is not ill-appearing, toxic-appearing or diaphoretic.  Cardiovascular:     Rate and Rhythm: Normal rate and regular rhythm.     Heart sounds: No murmur heard. Pulmonary:     Effort: Pulmonary effort is normal.     Breath sounds: Normal breath sounds.  Abdominal:     General: There is no distension.     Palpations: Abdomen is soft.     Comments: She is a little tender in her suprapubic area.  Skin:    Coloration: Skin is not pale.  Neurological:     General: No focal deficit present.     Mental Status: She is alert and oriented to person, place, and time.  Psychiatric:        Behavior: Behavior normal.      UC Treatments / Results  Labs (all labs ordered are listed,  but only abnormal results are displayed) Labs Reviewed  POCT URINALYSIS DIP (MANUAL ENTRY) - Abnormal; Notable for the following components:      Result Value   Color, UA straw (*)    Clarity, UA cloudy (*)    Bilirubin, UA small (*)    Ketones, POC UA small (15) (*)    Spec Grav, UA >=1.030 (*)    Blood, UA large (*)    Protein Ur, POC =100 (*)    Leukocytes, UA Moderate (2+) (*)    All other components within normal limits  URINE CULTURE  POCT URINE PREGNANCY  CERVICOVAGINAL ANCILLARY ONLY    EKG   Radiology No results found.  Procedures Procedures (including critical care time)  Medications Ordered in UC Medications - No data to display  Initial Impression / Assessment and Plan / UC Course  I have reviewed the triage vital signs and the nursing notes.  Pertinent labs & imaging results that were available during my care of the patient were reviewed by me and considered in my medical decision making (see chart for details).       UPT is negative UA shows a large amount of blood and moderate leukocytes.  I am going to treat for possible UTI with Macrodantin and culture the urine.  Vaginal self swab is done, and we will notify of any positives on that and treat per protocol.   Final Clinical Impressions(s) / UC Diagnoses   Final diagnoses:  Dysuria     Discharge Instructions      The pregnancy test was negative  The urinalysis had blood and white blood cells on it.  This could be consistent with a urinary infection.  Take nitrofurantoin 100 mg--1 capsule 2 times daily for 5 days; this is the antibiotic for possible urinary infection. Urine culture is sent.  Staff will notify you if it looks like the antibiotic needs to be changed  Staff will notify you if there is anything positive on the swab.  ED Prescriptions     Medication Sig Dispense Auth. Provider   nitrofurantoin, macrocrystal-monohydrate, (MACROBID) 100 MG capsule Take 1 capsule  (100 mg total) by mouth 2 (two) times daily. 10 capsule Zenia Resides, MD      PDMP not reviewed this encounter.   Zenia Resides, MD 03/30/23 [phone]

## 2023-03-30 NOTE — Discharge Instructions (Signed)
The pregnancy test was negative  The urinalysis had blood and white blood cells on it.  This could be consistent with a urinary infection.  Take nitrofurantoin 100 mg--1 capsule 2 times daily for 5 days; this is the antibiotic for possible urinary infection. Urine culture is sent.  Staff will notify you if it looks like the antibiotic needs to be changed  Staff will notify you if there is anything positive on the swab.

## 2023-03-30 NOTE — ED Triage Notes (Signed)
Patient reports that she is having lower abdominal cramping, dysuria, and lower back pain x 3-4 days.  Patient denies taking any medications for her symptoms.

## 2023-04-01 ENCOUNTER — Telehealth: Payer: Self-pay

## 2023-04-01 MED ORDER — METRONIDAZOLE 500 MG PO TABS: 500.0000 mg | ORAL_TABLET | Freq: Two times a day (BID) | ORAL | 0 refills | Status: DC

## 2023-04-02 ENCOUNTER — Emergency Department (HOSPITAL_COMMUNITY): Payer: No Typology Code available for payment source

## 2023-04-02 ENCOUNTER — Emergency Department (HOSPITAL_COMMUNITY)
Admission: EM | Admit: 2023-04-02 | Discharge: 2023-04-02 | Disposition: A | Discharge status: Home / Self Care | Payer: No Typology Code available for payment source

## 2023-04-02 ENCOUNTER — Other Ambulatory Visit: Payer: Self-pay

## 2023-04-02 ENCOUNTER — Encounter (HOSPITAL_COMMUNITY): Payer: Self-pay

## 2023-04-02 DIAGNOSIS — K029 Dental caries, unspecified: Secondary | ICD-10-CM | POA: Insufficient documentation

## 2023-04-02 DIAGNOSIS — Y92410 Unspecified street and highway as the place of occurrence of the external cause: Secondary | ICD-10-CM | POA: Insufficient documentation

## 2023-04-02 DIAGNOSIS — S0083XA Contusion of other part of head, initial encounter: Secondary | ICD-10-CM | POA: Insufficient documentation

## 2023-04-02 DIAGNOSIS — S20412A Abrasion of left back wall of thorax, initial encounter: Secondary | ICD-10-CM | POA: Diagnosis not present

## 2023-04-02 DIAGNOSIS — M79641 Pain in right hand: Secondary | ICD-10-CM | POA: Insufficient documentation

## 2023-04-02 DIAGNOSIS — M79644 Pain in right finger(s): Secondary | ICD-10-CM

## 2023-04-02 DIAGNOSIS — R519 Headache, unspecified: Secondary | ICD-10-CM | POA: Diagnosis present

## 2023-04-02 LAB — POC URINE PREG, ED: Preg Test, Ur: NEGATIVE

## 2023-04-02 MED ORDER — AMOXICILLIN-POT CLAVULANATE 875-125 MG PO TABS: 1.0000 | ORAL_TABLET | Freq: Two times a day (BID) | ORAL | 0 refills | Status: AC

## 2023-04-02 MED ORDER — IBUPROFEN 400 MG PO TABS
400.0000 mg | ORAL_TABLET | Freq: Once | ORAL | Status: AC
  Administered 2023-04-02: 400 mg via ORAL
  Filled 2023-04-02: qty 1

## 2023-04-02 MED ORDER — ACETAMINOPHEN 325 MG PO TABS
650.0000 mg | ORAL_TABLET | Freq: Once | ORAL | Status: AC
  Administered 2023-04-02: 650 mg via ORAL
  Filled 2023-04-02: qty 2

## 2023-04-02 NOTE — Discharge Instructions (Addendum)
You may take over-the-counter Tylenol alternating with Motrin for pain.  Also appears you have a dental abscess, take antibiotics as prescribed, follow-up with your dentist.  Return immediately if develop fevers, chills, sudden onset headache, vision changes, chest pain, shortness of breath, abdominal pain, nausea vomiting or any new or worsening symptoms that are concerning to you.
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
## 2023-04-02 NOTE — ED Notes (Addendum)
PT moved

## 2023-04-02 NOTE — ED Notes (Signed)
X-ray at bedside

## 2023-04-02 NOTE — Progress Notes (Signed)
Orthopedic Tech Progress Note Patient Details:  [name] Schaefer January 21, 1996 [id number]  Ortho Devices Type of Ortho Device: Thumb velcro splint Ortho Device/Splint Location: RUE Ortho Device/Splint Interventions: Ordered, Application, Adjustment   Post Interventions Patient Tolerated: Well Instructions Provided: Care of device  Donald Pore 04/02/2023, 1:45 PM

## 2023-08-20 NOTE — L&D Delivery Note (Signed)
 [facility]N Faculty Practice Delivery Note  Kasidy KAOIR LOREE is a 28 y.o. [id number] s/p SVD at [redacted]w[redacted]d. She was admitted for PEC w/ SF.   ROM: 5h 35m with clear fluid GBS Status: PRESUMPTIVE NEGATIVE/-- (11/02 1038)   Labor Progress: Initial SVE: 1/20/-3. She then progressed to complete.   Delivery Date/Time: 06/21/24 at 0543 Delivery: Called to room and patient was complete and pushing. Head delivered LOA. Nuchal cord x1 reduced at the perineum. Shoulder and body delivered in usual fashion. Infant with spontaneous cry, placed on mother's abdomen, dried and stimulated. Cord clamped x 2 after 1-minute delay, and cut by sister of the mother. Cord blood drawn. Placenta delivered spontaneously with gentle cord traction. Fundus firm with massage and Pitocin . Labia, perineum, vagina, and cervix inspected inspected with no lacerations noted.  Baby Weight: pending  Placenta: Sent to pathology Complications: None Lacerations: none EBL: 20 mL Analgesia: Epidural   Infant:  APGAR (1 MIN):  Pending APGAR (5 MINS):  Pending

## 2023-10-14 DIAGNOSIS — G43909 Migraine, unspecified, not intractable, without status migrainosus: Secondary | ICD-10-CM | POA: Insufficient documentation

## 2023-11-09 ENCOUNTER — Encounter (HOSPITAL_COMMUNITY): Payer: Self-pay

## 2023-11-09 ENCOUNTER — Inpatient Hospital Stay (HOSPITAL_COMMUNITY): Payer: Self-pay

## 2023-11-09 ENCOUNTER — Inpatient Hospital Stay (HOSPITAL_COMMUNITY)
Admission: AD | Admit: 2023-11-09 | Discharge: 2023-11-09 | Disposition: A | Discharge status: Home / Self Care | Payer: Self-pay | Attending: Obstetrics and Gynecology | Admitting: Obstetrics and Gynecology

## 2023-11-09 DIAGNOSIS — O3680X0 Pregnancy with inconclusive fetal viability, not applicable or unspecified: Secondary | ICD-10-CM

## 2023-11-09 DIAGNOSIS — R109 Unspecified abdominal pain: Secondary | ICD-10-CM

## 2023-11-09 DIAGNOSIS — O26851 Spotting complicating pregnancy, first trimester: Secondary | ICD-10-CM | POA: Insufficient documentation

## 2023-11-09 DIAGNOSIS — M549 Dorsalgia, unspecified: Secondary | ICD-10-CM | POA: Insufficient documentation

## 2023-11-09 DIAGNOSIS — R1030 Lower abdominal pain, unspecified: Secondary | ICD-10-CM | POA: Insufficient documentation

## 2023-11-09 DIAGNOSIS — Z3A00 Weeks of gestation of pregnancy not specified: Secondary | ICD-10-CM | POA: Insufficient documentation

## 2023-11-09 DIAGNOSIS — O26891 Other specified pregnancy related conditions, first trimester: Secondary | ICD-10-CM | POA: Insufficient documentation

## 2023-11-09 LAB — URINALYSIS, ROUTINE W REFLEX MICROSCOPIC
Bilirubin Urine: NEGATIVE
Glucose, UA: NEGATIVE mg/dL
Ketones, ur: NEGATIVE mg/dL
Nitrite: NEGATIVE
Protein, ur: 30 mg/dL — AB
Specific Gravity, Urine: >1.03 — ABNORMAL HIGH (ref 1.005–1.030)
pH: 6 (ref 5.0–8.0)

## 2023-11-09 LAB — URINALYSIS, MICROSCOPIC (REFLEX)

## 2023-11-09 LAB — WET PREP, GENITAL
Sperm: NONE SEEN
Trich, Wet Prep: NONE SEEN
WBC, Wet Prep HPF POC: <10 (ref <10)
Yeast Wet Prep HPF POC: NONE SEEN

## 2023-11-09 LAB — CBC
HCT: 39 % (ref 36.0–46.0)
Hemoglobin: 13.3 g/dL (ref 12.0–15.0)
MCH: 31.3 pg (ref 26.0–34.0)
MCHC: 34.1 g/dL (ref 30.0–36.0)
MCV: 91.8 fL (ref 80.0–100.0)
Platelets: 255 10*3/uL (ref 150–400)
RBC: 4.25 MIL/uL (ref 3.87–5.11)
RDW: 13.2 % (ref 11.5–15.5)
WBC: 6.6 10*3/uL (ref 4.0–10.5)
nRBC: 0 % (ref 0.0–0.2)

## 2023-11-09 LAB — HCG, QUANTITATIVE, PREGNANCY: hCG, Beta Chain, Quant, S: 186 m[IU]/mL — ABNORMAL HIGH (ref <5)

## 2023-11-09 LAB — HIV ANTIBODY (ROUTINE TESTING W REFLEX): HIV Screen 4th Generation wRfx: NONREACTIVE

## 2023-11-09 LAB — POCT PREGNANCY, URINE: Preg Test, Ur: POSITIVE — AB

## 2023-11-09 NOTE — Discharge Instructions (Signed)
 Return to MAU: If you have heavier bleeding that soaks through more that 2 pads per hour for an hour or more If you bleed so much that you feel like you might pass out or you do pass out If you have significant abdominal pain that is not improved with Tylenol 1000 mg every 8 hours as needed for pain If you develop a fever > 100.5

## 2023-11-09 NOTE — MAU Note (Signed)
.  [name] Schaefer is a 28 y.o. at Unknown here in MAU reporting:pos hpt about 2 weeks ago. Unsure of LMP. Pt reports vag spotting that started today, and abd cramping for the past 2 weeks.   Onset of complaint: 2 weeks Pain score: 5 Vitals:   11/09/23 1331  BP: (!) 136/93  Pulse: 81  Resp: 16  Temp: 98.3 F (36.8 C)  SpO2: 100%      Lab orders placed from triage: ua upt

## 2023-11-09 NOTE — MAU Provider Note (Cosign Needed Addendum)
 History     CSN: [id number]  Arrival date and time: 11/09/23 1239   None     Chief Complaint  Patient presents with   Abdominal Pain   Back Pain   Vaginal Bleeding   Patient reports to MAU for vaginal spotting that started this morning and has been having lower abdominal cramping x 1 week. She denies any vaginal injury. Unsure of her LMP but had her nexplanon removed on 10/14/23.    OB History     Gravida  6   Para  2   Term  2   Preterm      AB  3   Living  2      SAB  1   IAB  2   Ectopic      Multiple  0   Live Births  2           Past Medical History:  Diagnosis Date   Anxiety    Depression     Past Surgical History:  Procedure Laterality Date   INDUCED ABORTION      Family History  Problem Relation Age of Onset   Hypertension Father    Diabetes Father    Diabetes Paternal Grandfather    Stroke Paternal Grandfather    Hypertension Paternal Grandfather    Diabetes Maternal Grandmother    Hypertension Maternal Grandmother    Diabetes Maternal Grandfather    Hypertension Maternal Grandfather    Diabetes Paternal Grandmother    Hypertension Paternal Grandmother     Social History   Tobacco Use   Smoking status: Never   Smokeless tobacco: Never  Vaping Use   Vaping status: Never Used  Substance Use Topics   Alcohol use: No   Drug use: No    Comment: Reports not drinking or using any drugs    Allergies: No Known Allergies  Medications Prior to Admission  Medication Sig Dispense Refill Last Dose/Taking   Etonogestrel (NEXPLANON [place]) Inject into the skin.      metroNIDAZOLE (FLAGYL) 500 MG tablet Take 1 tablet (500 mg total) by mouth 2 (two) times daily. 14 tablet 0    nitrofurantoin, macrocrystal-monohydrate, (MACROBID) 100 MG capsule Take 1 capsule (100 mg total) by mouth 2 (two) times daily. 10 capsule 0     Review of Systems  Constitutional: Negative.   Respiratory: Negative.    Cardiovascular: Negative.    Gastrointestinal:  Positive for abdominal pain.  Genitourinary: Negative.    Physical Exam   Blood pressure (!) 136/93, pulse 81, temperature 98.3 F (36.8 C), temperature source Oral, resp. rate 16, SpO2 100%.  Physical Exam Constitutional:      Appearance: She is well-developed.  Cardiovascular:     Rate and Rhythm: Normal rate and regular rhythm.  Pulmonary:     Effort: Pulmonary effort is normal.     Breath sounds: Normal breath sounds.  Abdominal:     General: Abdomen is flat. Bowel sounds are normal.     Palpations: Abdomen is soft.  Neurological:     Mental Status: She is alert and oriented to person, place, and time.  Psychiatric:        Mood and Affect: Mood normal.        Behavior: Behavior normal.     MAU Course  Procedures  MDM UPT UA WP CBC Ultrasound Beta hCG-186   Assessment and Plan  1. Pregnancy of unknown anatomic location (Primary) -per TVUS no gestational sac visualized  -Beta hCG 186, follow  up in 48hrs for repeat lab work.  2. Spotting and cramping affecting pregnancy, antepartum -Instructions given to return for heavy vaginal bleeding. -may take OTC tylenol for cramping    Elige Ko 11/09/2023, 2:33 PM   Attestation of Supervision of Student:  I confirm that I have verified the information documented in the nurse midwife student's note and that I have also personally reperformed the history, physical exam and all medical decision making activities.  I have verified that all services and findings are accurately documented in this student's note; and I agree with management and plan as outlined in the documentation. I have also made any necessary editorial changes.  Patient had PNC with [facility] with prior pregnancies; Nexplanon was inserted 10/20/2017. She had Nexplanon removed by Dr. Elon Spanner at [facility] on 10/14/2023. She desires to follow-up for repeat HCG with [facility] instead of [facility].  Raelyn Mora,  CNM [facility] [employer], [facility] 11/09/2023 8:13 PM

## 2023-11-10 LAB — GC/CHLAMYDIA PROBE AMP (~~LOC~~) NOT AT ARMC
Chlamydia: NEGATIVE
Comment: NEGATIVE
Comment: NORMAL
Neisseria Gonorrhea: NEGATIVE

## 2023-11-10 LAB — RPR: RPR Ser Ql: NONREACTIVE

## 2023-11-11 ENCOUNTER — Other Ambulatory Visit: Payer: Self-pay

## 2023-11-11 ENCOUNTER — Encounter: Payer: Self-pay | Admitting: Obstetrics and Gynecology

## 2023-11-11 ENCOUNTER — Other Ambulatory Visit (HOSPITAL_COMMUNITY): Payer: Self-pay | Admitting: Obstetrics and Gynecology

## 2023-11-11 DIAGNOSIS — O3680X0 Pregnancy with inconclusive fetal viability, not applicable or unspecified: Secondary | ICD-10-CM

## 2023-11-11 DIAGNOSIS — N3000 Acute cystitis without hematuria: Secondary | ICD-10-CM

## 2023-11-11 DIAGNOSIS — N390 Urinary tract infection, site not specified: Secondary | ICD-10-CM | POA: Insufficient documentation

## 2023-11-11 LAB — CULTURE, OB URINE: Culture: 30000 — AB

## 2023-11-11 MED ORDER — NITROFURANTOIN MONOHYD MACRO 100 MG PO CAPS: 100.0000 mg | ORAL_CAPSULE | Freq: Two times a day (BID) | ORAL | 0 refills | Status: DC

## 2023-11-11 NOTE — Progress Notes (Signed)
 Patient left before seeing RN.

## 2023-11-12 LAB — BETA HCG QUANT (REF LAB): hCG Quant: 295 m[IU]/mL

## 2023-11-13 ENCOUNTER — Other Ambulatory Visit: Payer: Self-pay

## 2023-11-13 ENCOUNTER — Encounter: Payer: Self-pay | Admitting: Obstetrics & Gynecology

## 2023-11-13 DIAGNOSIS — O3680X0 Pregnancy with inconclusive fetal viability, not applicable or unspecified: Secondary | ICD-10-CM

## 2023-11-13 NOTE — Progress Notes (Signed)
 Should repeat HCG 11/13/23

## 2023-11-15 LAB — BETA HCG QUANT (REF LAB): hCG Quant: 646 m[IU]/mL

## 2023-12-09 ENCOUNTER — Other Ambulatory Visit (INDEPENDENT_AMBULATORY_CARE_PROVIDER_SITE_OTHER): Payer: Self-pay

## 2023-12-09 ENCOUNTER — Ambulatory Visit

## 2023-12-09 VITALS — BP 136/86 | HR 79 | Wt 159.2 lb

## 2023-12-09 DIAGNOSIS — Z3481 Encounter for supervision of other normal pregnancy, first trimester: Secondary | ICD-10-CM | POA: Diagnosis not present

## 2023-12-09 DIAGNOSIS — Z3A08 8 weeks gestation of pregnancy: Secondary | ICD-10-CM

## 2023-12-09 DIAGNOSIS — Z1339 Encounter for screening examination for other mental health and behavioral disorders: Secondary | ICD-10-CM | POA: Diagnosis not present

## 2023-12-09 DIAGNOSIS — Z3480 Encounter for supervision of other normal pregnancy, unspecified trimester: Secondary | ICD-10-CM

## 2023-12-09 DIAGNOSIS — O3680X0 Pregnancy with inconclusive fetal viability, not applicable or unspecified: Secondary | ICD-10-CM

## 2023-12-09 MED ORDER — BLOOD PRESSURE KIT DEVI: 1.0000 | 0 refills | Status: DC

## 2023-12-09 MED ORDER — VITAFOL GUMMIES 3.33-0.333-34.8 MG PO CHEW: 1.0000 | CHEWABLE_TABLET | Freq: Every day | ORAL | 5 refills | Status: AC

## 2023-12-09 NOTE — Progress Notes (Signed)
 New OB Intake  I connected with [name] Schaefer  on 12/09/23 at  3:00 PM EDT by In PersonVisit and verified that I am speaking with the correct person using two identifiers. Nurse is located at [facility] and pt is located at [place].  I discussed the limitations, risks, security and privacy concerns of performing an evaluation and management service by telephone and the availability of in person appointments. I also discussed with the patient that there may be a patient responsible charge related to this service. The patient expressed understanding and agreed to proceed.  I explained I am completing New OB Intake today. We discussed EDD of Not found.. Pt is [id number]. I reviewed her allergies, medications and Medical/Surgical/OB history.    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 Discussed the nature of our practice with multiple providers including residents and students. Due to the size of the practice, the delivering provider may not be the same as those providing prenatal care.   Patient is not interested in water birth.  MyChart/Babyscripts MyChart access verified. I explained pt will have some visits in office and some virtually. Babyscripts instructions given and order placed. Patient verifies receipt of registration text/e-mail. Account successfully created and app downloaded. If patient is a candidate for Optimized scheduling, add to sticky note.   Blood Pressure Cuff/Weight Scale Blood pressure cuff ordered for patient to pick-up from [employer]. Explained after first prenatal appt pt will check weekly and document in Babyscripts. Patient does not have weight scale; patient may purchase  if they desire to track weight weekly in Babyscripts.  Anatomy US  Explained first scheduled US  will be around 19 weeks. Anatomy US  scheduled for TBD at TBD.  Is patient a candidate for Babyscripts Optimization? No, due to Risk Factors   First visit review I reviewed new OB appt with patient. Explained pt will be seen by Dr. Donetta Furl at first visit. Discussed [facility] [place] genetic screening with patient. Requests [facility] and [facility].. Routine prenatal labs  OB Urine only collected at today's visit. Initial OB labs deferred to New OB appointment.    Last Pap No results found for: "DIAGPAP"  Donette Furlong, RN 12/09/2023  3:03 PM

## 2023-12-09 NOTE — Patient Instructions (Signed)

## 2023-12-11 LAB — URINE CULTURE, OB REFLEX

## 2023-12-11 LAB — CULTURE, OB URINE

## 2023-12-30 ENCOUNTER — Telehealth: Admitting: Family Medicine

## 2023-12-30 DIAGNOSIS — N898 Other specified noninflammatory disorders of vagina: Secondary | ICD-10-CM

## 2023-12-30 NOTE — Progress Notes (Signed)

## 2024-01-06 ENCOUNTER — Other Ambulatory Visit (HOSPITAL_COMMUNITY)
Admission: RE | Admit: 2024-01-06 | Discharge: 2024-01-06 | Disposition: A | Discharge status: Home / Self Care | Source: Ambulatory Visit | Attending: Obstetrics and Gynecology | Admitting: Obstetrics and Gynecology

## 2024-01-06 ENCOUNTER — Ambulatory Visit: Admitting: Obstetrics and Gynecology

## 2024-01-06 VITALS — BP 111/73 | HR 77 | Wt 155.0 lb

## 2024-01-06 DIAGNOSIS — N390 Urinary tract infection, site not specified: Secondary | ICD-10-CM | POA: Diagnosis not present

## 2024-01-06 DIAGNOSIS — Z3A12 12 weeks gestation of pregnancy: Secondary | ICD-10-CM | POA: Insufficient documentation

## 2024-01-06 DIAGNOSIS — Z3481 Encounter for supervision of other normal pregnancy, first trimester: Secondary | ICD-10-CM | POA: Diagnosis not present

## 2024-01-06 DIAGNOSIS — R030 Elevated blood-pressure reading, without diagnosis of hypertension: Secondary | ICD-10-CM | POA: Diagnosis not present

## 2024-01-06 DIAGNOSIS — Z3480 Encounter for supervision of other normal pregnancy, unspecified trimester: Secondary | ICD-10-CM

## 2024-01-06 NOTE — Progress Notes (Signed)
 Subjective:   [name] Schaefer is a 28 y.o. [id number] at [redacted]w[redacted]d by early ultrasound being seen today for her first obstetrical visit.  Her obstetrical history is significant for none. Patient does not intend to breast feed. Pregnancy history fully reviewed.  Patient reports doing well overall.  HISTORY: OB History  Gravida Para Term Preterm AB Living  6 2 2  0 3 2  SAB IAB Ectopic Multiple Live Births  1 2 0 0 2    # Outcome Date GA Lbr Len/2nd Weight Sex Type Anes PTL Lv  6 Current           5 Term 09/17/17 [redacted]w[redacted]d 07:45 / 00:25 6 lb 11.2 oz (3.039 kg) M Vag-Spont EPI  LIV     Name: Rueda,BOY Joanie     Apgar1: 9  Apgar5: 9  4 Term 11/27/14 [redacted]w[redacted]d 09:17 / 00:30 6 lb 6.8 oz (2.914 kg) M Vag-Spont EPI  LIV     Birth Comments: WNL     Apgar1: 9  Apgar5: 9  3 IAB 07/2013          2 SAB           1 IAB              Last pap smear: No results found for: "DIAGPAP", "HPV", "HPVHIGH"  Past Medical History:  Diagnosis Date   Anxiety    Depression    Past Surgical History:  Procedure Laterality Date   INDUCED ABORTION     Family History  Problem Relation Age of Onset   Healthy Mother    Hypertension Father    Diabetes Father    Diabetes Maternal Grandmother    Hypertension Maternal Grandmother    Diabetes Maternal Grandfather    Hypertension Maternal Grandfather    Diabetes Paternal Grandmother    Hypertension Paternal Grandmother    Diabetes Paternal Grandfather    Stroke Paternal Grandfather    Hypertension Paternal Grandfather    Social History   Tobacco Use   Smoking status: Never   Smokeless tobacco: Never  Vaping Use   Vaping status: Never Used  Substance Use Topics   Alcohol use: Not Currently    Alcohol/week: 1.0 standard drink of alcohol    Types: 1 Shots of liquor per week   Drug use: No    Comment: Reports not drinking or using any drugs   No Known Allergies Current Outpatient Medications on File Prior to Visit  Medication Sig Dispense Refill    Blood Pressure Monitoring (BLOOD PRESSURE KIT) DEVI 1 Device by Does not apply route once a week. 1 each 0   Prenatal Vit-Fe Phos-FA-Omega (VITAFOL  GUMMIES) 3.33-0.333-34.8 MG CHEW Chew 1 tablet by mouth daily. 90 tablet 5   No current facility-administered medications on file prior to visit.   Exam   Vitals:   01/06/24 1328  BP: 111/73  Pulse: 77  Weight: 155 lb (70.3 kg)   Fetal Heart Rate (bpm): 154  General:  Alert, oriented and cooperative. Patient is in no acute distress.  Breast: Deferred  Cardiovascular: Normal heart rate noted  Respiratory: Normal respiratory effort, no problems with respiration noted  Abdomen: Soft, non tender  Pelvic:  Performed in presence of chaperone Cervix visually normal, pap collected  Extremities: Normal range of motion.  Edema: None  Mental Status: Normal mood and affect. Normal behavior. Normal judgment and thought content.    Assessment:   Pregnancy: [id number] Patient Active Problem List   Diagnosis Date  Noted   Supervision of other normal pregnancy, antepartum 12/09/2023   UTI (urinary tract infection) 11/11/2023   Migraine 10/14/2023     Plan:  1. Supervision of other normal pregnancy, antepartum (Primary) 2. [redacted] weeks gestation of pregnancy Initial labs drawn. Continue prenatal vitamins. Genetic Screening discussed: NIPS, carrier screening and AFP, ordered. Ultrasound discussed; fetal anatomic survey: ordered. Problem list reviewed and updated. The nature of [facility] with multiple MDs and other Advanced Practice Providers was explained to patient; also emphasized that residents, students are part of our team. Routine obstetric precautions reviewed. No babyscripts due to elevated BP at NOB intake - Cytology - PAP( [place]) - HgB A1c - CBC/D/Plt+RPR+Rh+ABO+RubIgG... - PANORAMA PRENATAL TEST - HORIZON Basic Panel  3. Elevated blood pressure reading MR BP at new OB intake, normotensive  today Home BP normal per patient, did not bring log Will collect baseline labs - Comp Met (CMET) - Protein / creatinine ratio, urine  4. Urinary tract infection without hematuria, site unspecified Negative TOC  Return in about 4 weeks (around 02/03/2024) for return OB at 16 weeks.  Loralyn Rochester, MD Obstetrician & Gynecologist, [facility] for [employer], [facility]

## 2024-01-07 LAB — COMPREHENSIVE METABOLIC PANEL WITH GFR
ALT: 10 IU/L (ref 0–32)
AST: 13 IU/L (ref 0–40)
Albumin: 4 g/dL (ref 4.0–5.0)
Alkaline Phosphatase: 63 IU/L (ref 44–121)
BUN/Creatinine Ratio: 13 (ref 9–23)
BUN: 7 mg/dL (ref 6–20)
Bilirubin Total: 0.2 mg/dL (ref 0.0–1.2)
CO2: 20 mmol/L (ref 20–29)
Calcium: 9.5 mg/dL (ref 8.7–10.2)
Chloride: 100 mmol/L (ref 96–106)
Creatinine, Ser: 0.53 mg/dL — ABNORMAL LOW (ref 0.57–1.00)
Globulin, Total: 3 g/dL (ref 1.5–4.5)
Glucose: 82 mg/dL (ref 70–99)
Potassium: 4.3 mmol/L (ref 3.5–5.2)
Sodium: 136 mmol/L (ref 134–144)
Total Protein: 7 g/dL (ref 6.0–8.5)
eGFR: 131 mL/min/{1.73_m2} (ref >59)

## 2024-01-07 LAB — CBC/D/PLT+RPR+RH+ABO+RUBIGG...
Antibody Screen: NEGATIVE
Basophils Absolute: 0 10*3/uL (ref 0.0–0.2)
Basos: 1 %
EOS (ABSOLUTE): 0.1 10*3/uL (ref 0.0–0.4)
Eos: 3 %
HCV Ab: NONREACTIVE
HIV Screen 4th Generation wRfx: NONREACTIVE
Hematocrit: 36.9 % (ref 34.0–46.6)
Hemoglobin: 12.2 g/dL (ref 11.1–15.9)
Hepatitis B Surface Ag: NEGATIVE
Immature Grans (Abs): 0 10*3/uL (ref 0.0–0.1)
Immature Granulocytes: 1 %
Lymphocytes Absolute: 1.6 10*3/uL (ref 0.7–3.1)
Lymphs: 32 %
MCH: 30.9 pg (ref 26.6–33.0)
MCHC: 33.1 g/dL (ref 31.5–35.7)
MCV: 93 fL (ref 79–97)
Monocytes Absolute: 0.5 10*3/uL (ref 0.1–0.9)
Monocytes: 11 %
Neutrophils Absolute: 2.8 10*3/uL (ref 1.4–7.0)
Neutrophils: 52 %
Platelets: 268 10*3/uL (ref 150–450)
RBC: 3.95 x10E6/uL (ref 3.77–5.28)
RDW: 12.2 % (ref 11.7–15.4)
RPR Ser Ql: NONREACTIVE
Rh Factor: POSITIVE
Rubella Antibodies, IGG: 7.17 {index} (ref >0.99)
WBC: 5.2 10*3/uL (ref 3.4–10.8)

## 2024-01-07 LAB — HCV INTERPRETATION

## 2024-01-07 LAB — HEMOGLOBIN A1C
Est. average glucose Bld gHb Est-mCnc: 108 mg/dL
Hgb A1c MFr Bld: 5.4 % (ref 4.8–5.6)

## 2024-01-08 ENCOUNTER — Ambulatory Visit: Payer: Self-pay | Admitting: Obstetrics and Gynecology

## 2024-01-08 DIAGNOSIS — Z3480 Encounter for supervision of other normal pregnancy, unspecified trimester: Secondary | ICD-10-CM

## 2024-01-08 DIAGNOSIS — R030 Elevated blood-pressure reading, without diagnosis of hypertension: Secondary | ICD-10-CM

## 2024-01-08 DIAGNOSIS — R87612 Low grade squamous intraepithelial lesion on cytologic smear of cervix (LGSIL): Secondary | ICD-10-CM

## 2024-01-08 LAB — PROTEIN / CREATININE RATIO, URINE
Creatinine, Urine: 147.1 mg/dL
Protein, Ur: 20.7 mg/dL
Protein/Creat Ratio: 141 mg/g{creat} (ref 0–200)

## 2024-01-11 LAB — PANORAMA PRENATAL TEST FULL PANEL:PANORAMA TEST PLUS 5 ADDITIONAL MICRODELETIONS: FETAL FRACTION: 8

## 2024-01-12 LAB — HORIZON CUSTOM: REPORT SUMMARY: NEGATIVE

## 2024-01-13 LAB — CYTOLOGY - PAP

## 2024-01-14 DIAGNOSIS — R87612 Low grade squamous intraepithelial lesion on cytologic smear of cervix (LGSIL): Secondary | ICD-10-CM | POA: Insufficient documentation

## 2024-02-03 ENCOUNTER — Other Ambulatory Visit (HOSPITAL_COMMUNITY)
Admission: RE | Admit: 2024-02-03 | Discharge: 2024-02-03 | Disposition: A | Discharge status: Home / Self Care | Source: Ambulatory Visit | Attending: Obstetrics & Gynecology | Admitting: Obstetrics & Gynecology

## 2024-02-03 ENCOUNTER — Ambulatory Visit (INDEPENDENT_AMBULATORY_CARE_PROVIDER_SITE_OTHER): Admitting: Obstetrics & Gynecology

## 2024-02-03 VITALS — BP 107/73 | HR 89 | Wt 154.0 lb

## 2024-02-03 DIAGNOSIS — Z3480 Encounter for supervision of other normal pregnancy, unspecified trimester: Secondary | ICD-10-CM | POA: Diagnosis present

## 2024-02-03 DIAGNOSIS — Z3A16 16 weeks gestation of pregnancy: Secondary | ICD-10-CM | POA: Diagnosis not present

## 2024-02-03 LAB — POCT URINALYSIS DIPSTICK
Bilirubin, UA: NEGATIVE
Glucose, UA: NEGATIVE
Ketones, UA: NEGATIVE
Nitrite, UA: NEGATIVE
Protein, UA: POSITIVE — AB
Spec Grav, UA: 1.015 (ref 1.010–1.025)
Urobilinogen, UA: 0.2 U/dL
pH, UA: 6.5 (ref 5.0–8.0)

## 2024-02-03 NOTE — Progress Notes (Signed)
 Pt complains of UTI symptoms and vaginal irritation.

## 2024-02-03 NOTE — Progress Notes (Signed)
   PRENATAL VISIT NOTE  Subjective:  [name] Schaefer is a 28 y.o. [id number] at [redacted]w[redacted]d being seen today for ongoing prenatal care.  She is currently monitored for the following issues for this low-risk pregnancy and has UTI (urinary tract infection); Migraine; Supervision of other normal pregnancy, antepartum; Elevated blood pressure reading; and LGSIL on Pap smear of cervix on their problem list.  Patient reports vaginal irritation.  Contractions: Not present. Vag. Bleeding: None.  Movement: Absent. Denies leaking of fluid.   The following portions of the patient's history were reviewed and updated as appropriate: allergies, current medications, past family history, past medical history, past social history, past surgical history and problem list.   Objective:    Vitals:   02/03/24 0948  BP: 107/73  Pulse: 89  Weight: 154 lb (69.9 kg)    Fetal Status:  Fetal Heart Rate (bpm): 155   Movement: Absent    General: Alert, oriented and cooperative. Patient is in no acute distress.  Skin: Skin is warm and dry. No rash noted.   Cardiovascular: Normal heart rate noted  Respiratory: Normal respiratory effort, no problems with respiration noted  Abdomen: Soft, gravid, appropriate for gestational age.  Pain/Pressure: Present     Pelvic: Cervical exam deferred        Extremities: Normal range of motion.     Mental Status: Normal mood and affect. Normal behavior. Normal judgment and thought content.   Assessment and Plan:  Pregnancy: A5W0981 at 102w0d 1. Supervision of other normal pregnancy, antepartum (Primary)  - AFP, Serum, Open Spina Bifida - Cervicovaginal ancillary only( [place]) - POCT urinalysis dipstick - Culture, OB Urine - US  MFM OB DETAIL +14 WK; Future  2. [redacted] weeks gestation of pregnancy  - AFP, Serum, Open Spina Bifida - Culture, OB Urine - US  MFM OB DETAIL +14 WK; Future  Preterm labor symptoms and general obstetric precautions including but not limited to vaginal  bleeding, contractions, leaking of fluid and fetal movement were reviewed in detail with the patient. Please refer to After Visit Summary for other counseling recommendations.   Return in about 4 weeks (around 03/02/2024).  No future appointments.  Onnie Bilis, MD

## 2024-02-04 LAB — CERVICOVAGINAL ANCILLARY ONLY
Bacterial Vaginitis (gardnerella): POSITIVE — AB
Candida Glabrata: NEGATIVE
Candida Vaginitis: NEGATIVE
Chlamydia: NEGATIVE
Comment: NEGATIVE
Comment: NEGATIVE
Comment: NEGATIVE
Comment: NEGATIVE
Comment: NEGATIVE
Comment: NORMAL
Neisseria Gonorrhea: NEGATIVE
Trichomonas: NEGATIVE

## 2024-02-05 LAB — AFP, SERUM, OPEN SPINA BIFIDA
AFP MoM: 2.11
AFP Value: 75.6 ng/mL
Gest. Age on Collection Date: 16 wk
Maternal Age At EDD: 27.3 a
OSBR Risk 1 IN: 1267
Test Results:: NEGATIVE
Weight: 154 [lb_av]

## 2024-02-06 LAB — CULTURE, OB URINE

## 2024-02-06 LAB — URINE CULTURE, OB REFLEX

## 2024-02-10 ENCOUNTER — Ambulatory Visit: Payer: Self-pay | Admitting: Obstetrics & Gynecology

## 2024-02-10 ENCOUNTER — Other Ambulatory Visit: Payer: Self-pay

## 2024-02-10 DIAGNOSIS — B9689 Other specified bacterial agents as the cause of diseases classified elsewhere: Secondary | ICD-10-CM

## 2024-02-10 MED ORDER — METRONIDAZOLE 500 MG PO TABS
500.0000 mg | ORAL_TABLET | Freq: Two times a day (BID) | ORAL | 0 refills | Status: DC
Start: 2024-02-10 — End: 2024-03-08

## 2024-02-28 ENCOUNTER — Encounter (HOSPITAL_COMMUNITY): Payer: Self-pay | Admitting: Obstetrics and Gynecology

## 2024-02-28 ENCOUNTER — Other Ambulatory Visit: Payer: Self-pay

## 2024-02-28 ENCOUNTER — Inpatient Hospital Stay (HOSPITAL_COMMUNITY)
Admission: AD | Admit: 2024-02-28 | Discharge: 2024-02-28 | Disposition: A | Discharge status: Home / Self Care | Attending: Obstetrics and Gynecology | Admitting: Obstetrics and Gynecology

## 2024-02-28 DIAGNOSIS — O23592 Infection of other part of genital tract in pregnancy, second trimester: Secondary | ICD-10-CM | POA: Insufficient documentation

## 2024-02-28 DIAGNOSIS — M549 Dorsalgia, unspecified: Secondary | ICD-10-CM | POA: Insufficient documentation

## 2024-02-28 DIAGNOSIS — B3731 Acute candidiasis of vulva and vagina: Secondary | ICD-10-CM | POA: Diagnosis not present

## 2024-02-28 DIAGNOSIS — O26892 Other specified pregnancy related conditions, second trimester: Secondary | ICD-10-CM

## 2024-02-28 DIAGNOSIS — Z0371 Encounter for suspected problem with amniotic cavity and membrane ruled out: Secondary | ICD-10-CM | POA: Diagnosis present

## 2024-02-28 DIAGNOSIS — O98812 Other maternal infectious and parasitic diseases complicating pregnancy, second trimester: Secondary | ICD-10-CM | POA: Insufficient documentation

## 2024-02-28 DIAGNOSIS — Z3A19 19 weeks gestation of pregnancy: Secondary | ICD-10-CM

## 2024-02-28 DIAGNOSIS — N898 Other specified noninflammatory disorders of vagina: Secondary | ICD-10-CM

## 2024-02-28 LAB — URINALYSIS, ROUTINE W REFLEX MICROSCOPIC
Bilirubin Urine: NEGATIVE
Glucose, UA: NEGATIVE mg/dL
Ketones, ur: 20 mg/dL — AB
Nitrite: NEGATIVE
Protein, ur: 30 mg/dL — AB
Specific Gravity, Urine: 1.025 (ref 1.005–1.030)
pH: 6 (ref 5.0–8.0)

## 2024-02-28 LAB — WET PREP, GENITAL
Clue Cells Wet Prep HPF POC: NONE SEEN
Sperm: NONE SEEN
Trich, Wet Prep: NONE SEEN
WBC, Wet Prep HPF POC: >=10 — AB (ref <10)

## 2024-02-28 LAB — RUPTURE OF MEMBRANE (ROM)PLUS: Rom Plus: NEGATIVE

## 2024-02-28 MED ORDER — TERCONAZOLE 0.4 % VA CREA: 1.0000 | TOPICAL_CREAM | Freq: Every day | VAGINAL | 0 refills | Status: DC

## 2024-02-28 NOTE — MAU Note (Signed)
.  [name] Schaefer is a 28 y.o. at [redacted]w[redacted]d here in MAU reporting: Patient reports yesterday at 2100 she was having pale yellow leakage. At 2200 she noticed another gush, but that time it was watery. Patient reports that she continues to leak this morning it was up her back side.  Reports a small amount of vaginal bleeding with the first episode of leaking at 2100 Patient reports pain in her back 5/10  Onset of complaint: last night 2100 Pain score: 5/10  back  Vitals:   02/28/24 1141  BP: 116/71  Pulse: 98  Resp: 12  Temp: 98.9 F (37.2 C)  SpO2: 96%     FHT: 150 Lab orders placed from triage:

## 2024-02-28 NOTE — MAU Provider Note (Signed)
 History     CSN: [id number]  Arrival date and time: 02/28/24 1119   None     Chief Complaint  Patient presents with   Rupture of Membranes   Back Pain   HPI  Ms.[name] Schaefer is a 28 y.o. female [id number] @[redacted]w[redacted]d  here in MAU with leaking of fluid. The symptoms started yesterday, no recent intercourse. She was recently treated for BV and completed the antibiotics. She reports the fluid is yellow in color and leaking in her underwear.    OB History     Gravida  6   Para  2   Term  2   Preterm  0   AB  3   Living  2      SAB  1   IAB  2   Ectopic  0   Multiple  0   Live Births  2           Past Medical History:  Diagnosis Date   Anxiety    Depression     Past Surgical History:  Procedure Laterality Date   INDUCED ABORTION      Family History  Problem Relation Age of Onset   Healthy Mother    Hypertension Father    Diabetes Father    Diabetes Maternal Grandmother    Hypertension Maternal Grandmother    Diabetes Maternal Grandfather    Hypertension Maternal Grandfather    Diabetes Paternal Grandmother    Hypertension Paternal Grandmother    Diabetes Paternal Grandfather    Stroke Paternal Grandfather    Hypertension Paternal Grandfather     Social History   Tobacco Use   Smoking status: Never   Smokeless tobacco: Never  Vaping Use   Vaping status: Never Used  Substance Use Topics   Alcohol use: Not Currently    Alcohol/week: 1.0 standard drink of alcohol    Types: 1 Shots of liquor per week   Drug use: No    Comment: Reports not drinking or using any drugs    Allergies: No Known Allergies  Medications Prior to Admission  Medication Sig Dispense Refill Last Dose/Taking   Prenatal Vit-Fe Phos-FA-Omega (VITAFOL  GUMMIES) 3.33-0.333-34.8 MG CHEW Chew 1 tablet by mouth daily. 90 tablet 5 02/28/2024   Blood Pressure Monitoring (BLOOD PRESSURE KIT) DEVI 1 Device by Does not apply route once a week. 1 each 0    metroNIDAZOLE  (FLAGYL )  500 MG tablet Take 1 tablet (500 mg total) by mouth 2 (two) times daily. 14 tablet 0    Results for orders placed or performed during the hospital encounter of 02/28/24 (from the past 48 hours)  Urinalysis, Routine w reflex microscopic -Urine, Clean Catch     Status: Abnormal   Collection Time: 02/28/24 11:24 AM  Result Value Ref Range   Color, Urine AMBER (A) YELLOW    Comment: BIOCHEMICALS MAY BE AFFECTED BY COLOR   APPearance CLOUDY (A) CLEAR   Specific Gravity, Urine 1.025 1.005 - 1.030   pH 6.0 5.0 - 8.0   Glucose, UA NEGATIVE NEGATIVE mg/dL   Hgb urine dipstick SMALL (A) NEGATIVE   Bilirubin Urine NEGATIVE NEGATIVE   Ketones, ur 20 (A) NEGATIVE mg/dL   Protein, ur 30 (A) NEGATIVE mg/dL   Nitrite NEGATIVE NEGATIVE   Leukocytes,Ua LARGE (A) NEGATIVE   RBC / HPF 11-20 0 - 5 RBC/hpf   WBC, UA 21-50 0 - 5 WBC/hpf   Bacteria, UA RARE (A) NONE SEEN   Squamous Epithelial / HPF 11-20 0 -  5 /HPF   Mucus PRESENT    Hyphae Yeast PRESENT     Comment: Performed at [facility], [address], [place], [place] [address]  Rupture of Membrane (ROM) Plus     Status: None   Collection Time: 02/28/24 12:03 PM  Result Value Ref Range   Rom Plus NEGATIVE     Comment: Performed at [facility], [address], [place], [place] [address]  Wet prep, genital     Status: Abnormal   Collection Time: 02/28/24 12:03 PM   Specimen: Vaginal  Result Value Ref Range   Yeast Wet Prep HPF POC PRESENT (A) NONE SEEN   Trich, Wet Prep NONE SEEN NONE SEEN   Clue Cells Wet Prep HPF POC NONE SEEN NONE SEEN   WBC, Wet Prep HPF POC >=10 (A) <10   Sperm NONE SEEN     Comment: Performed at [facility], [address], [place], [place] [address]    Review of Systems  Gastrointestinal:  Negative for abdominal pain.  Genitourinary:  Positive for vaginal bleeding and vaginal discharge.   Physical Exam   Blood pressure 116/71, pulse 98, temperature 98.9 F (37.2 C), resp. rate 12, SpO2  96%.  Physical Exam Constitutional:      General: She is not in acute distress.    Appearance: Normal appearance. She is not ill-appearing, toxic-appearing or diaphoretic.  Genitourinary:    Comments: Vagina - Large amount of thick, clumpy, white/yellow vaginal discharge, no odor  Cervix - No contact bleeding, no active bleeding, appears closed.  Bimanual exam: deferred  GC/Chlam, wet prep done Chaperone present for exam.   Skin:    General: Skin is warm.     Capillary Refill: Capillary refill takes more than 3 seconds.  Neurological:     Mental Status: She is alert.    MAU Course  Procedures  MDM  + fetal heart tones Rom plus negative.   Assessment and Plan   A:  1. Vaginal discharge during pregnancy in second trimester   2. Yeast vaginitis   3. [redacted] weeks gestation of pregnancy      P:  Dc home  Rx: Terazol Return If symptoms worsen Urine culture pending   Dorita Nest I, NP 02/28/2024 1:05 PM

## 2024-02-29 LAB — CULTURE, OB URINE

## 2024-03-01 LAB — GC/CHLAMYDIA PROBE AMP (~~LOC~~) NOT AT ARMC
Chlamydia: NEGATIVE
Comment: NEGATIVE
Comment: NORMAL
Neisseria Gonorrhea: NEGATIVE

## 2024-03-02 ENCOUNTER — Ambulatory Visit (INDEPENDENT_AMBULATORY_CARE_PROVIDER_SITE_OTHER): Admitting: Advanced Practice Midwife

## 2024-03-02 ENCOUNTER — Encounter: Payer: Self-pay | Admitting: Advanced Practice Midwife

## 2024-03-02 VITALS — BP 116/80 | HR 88 | Wt 159.2 lb

## 2024-03-02 DIAGNOSIS — Z3480 Encounter for supervision of other normal pregnancy, unspecified trimester: Secondary | ICD-10-CM

## 2024-03-02 DIAGNOSIS — Z3A20 20 weeks gestation of pregnancy: Secondary | ICD-10-CM | POA: Diagnosis not present

## 2024-03-02 DIAGNOSIS — R030 Elevated blood-pressure reading, without diagnosis of hypertension: Secondary | ICD-10-CM

## 2024-03-02 NOTE — Progress Notes (Signed)
 Pt states everything is going well. No concerns today. ROB

## 2024-03-02 NOTE — Progress Notes (Signed)
   PRENATAL VISIT NOTE  Subjective:  [name] Schaefer is a 28 y.o. H3E7967 at [redacted]w[redacted]d being seen today for ongoing prenatal care.  She is currently monitored for the following issues for this low-risk pregnancy and has UTI (urinary tract infection); Migraine; Supervision of other normal pregnancy, antepartum; Elevated blood pressure reading; and LGSIL on Pap smear of cervix on their problem list.  Patient reports no complaints.  Contractions: Not present. Vag. Bleeding: None.  Movement: Increased. Denies leaking of fluid.   The following portions of the patient's history were reviewed and updated as appropriate: allergies, current medications, past family history, past medical history, past social history, past surgical history and problem list.   Objective:    Vitals:   03/02/24 0950  BP: 116/80  Pulse: 88  Weight: 159 lb 3.2 oz (72.2 kg)    Fetal Status:  Fetal Heart Rate (bpm): 151 Fundal Height: 20 cm Movement: Increased    General: Alert, oriented and cooperative. Patient is in no acute distress.  Skin: Skin is warm and dry. No rash noted.   Cardiovascular: Normal heart rate noted  Respiratory: Normal respiratory effort, no problems with respiration noted  Abdomen: Soft, gravid, appropriate for gestational age.  Pain/Pressure: Absent     Pelvic: Cervical exam deferred        Extremities: Normal range of motion.  Edema: None  Mental Status: Normal mood and affect. Normal behavior. Normal judgment and thought content.   Assessment and Plan:  Pregnancy: H3E7967 at 102w0d 1. Supervision of other normal pregnancy, antepartum (Primary) --Anticipatory guidance about next visits/weeks of pregnancy given.  --anatomy US  on 7/24  2. Elevated blood pressure reading --Normotensive x 4 visits, spurious elevation  3. [redacted] weeks gestation of pregnancy   Preterm labor symptoms and general obstetric precautions including but not limited to vaginal bleeding, contractions, leaking of fluid and  fetal movement were reviewed in detail with the patient. Please refer to After Visit Summary for other counseling recommendations.   Return in about 4 weeks (around 03/30/2024) for LOB.  Future Appointments  Date Time Provider Department Center  03/11/2024  7:00 AM [facility] PROVIDER 1 [facility]  03/11/2024  7:30 AM [facility] US2 [facility]    Olam Boards, CNM

## 2024-03-08 ENCOUNTER — Telehealth: Admitting: Physician Assistant

## 2024-03-08 DIAGNOSIS — B379 Candidiasis, unspecified: Secondary | ICD-10-CM | POA: Diagnosis not present

## 2024-03-08 DIAGNOSIS — T3695XA Adverse effect of unspecified systemic antibiotic, initial encounter: Secondary | ICD-10-CM

## 2024-03-08 DIAGNOSIS — R3989 Other symptoms and signs involving the genitourinary system: Secondary | ICD-10-CM | POA: Diagnosis not present

## 2024-03-08 MED ORDER — FLUCONAZOLE 150 MG PO TABS: 150.0000 mg | ORAL_TABLET | Freq: Once | ORAL | 0 refills | Status: AC

## 2024-03-08 MED ORDER — CEPHALEXIN 500 MG PO CAPS: 500.0000 mg | ORAL_CAPSULE | Freq: Two times a day (BID) | ORAL | 0 refills | Status: DC

## 2024-03-08 NOTE — Addendum Note (Signed)
 Addended by: VIVIENNE DELON HERO on: 03/08/2024 06:06 PM   Modules accepted: Orders

## 2024-03-08 NOTE — Progress Notes (Signed)
 E-Visit for Urinary Problems  We are sorry that you are not feeling well.  Here is how we plan to help!  Based on what you shared with me it looks like you most likely have a simple urinary tract infection.  A UTI (Urinary Tract Infection) is a bacterial infection of the bladder.  Most cases of urinary tract infections are simple to treat but a key part of your care is to encourage you to drink plenty of fluids and watch your symptoms carefully.  I have prescribed Keflex  500 mg twice a day for 7 days, safe in pregnancy.  Your symptoms should gradually improve. Call us  if the burning in your urine worsens, you develop worsening fever, back pain or pelvic pain or if your symptoms do not resolve after completing the antibiotic.  Urinary tract infections can be prevented by drinking plenty of water to keep your body hydrated.  Also be sure when you wipe, wipe from front to back and don't hold it in!  If possible, empty your bladder every 4 hours.  HOME CARE Drink plenty of fluids Compete the full course of the antibiotics even if the symptoms resolve Remember, when you need to go.go. Holding in your urine can increase the likelihood of getting a UTI! GET HELP RIGHT AWAY IF: You cannot urinate You get a high fever Worsening back pain occurs You see blood in your urine You feel sick to your stomach or throw up You feel like you are going to pass out  MAKE SURE YOU  Understand these instructions. Will watch your condition. Will get help right away if you are not doing well or get worse.   Thank you for choosing an e-visit.  Your e-visit answers were reviewed by a board certified advanced clinical practitioner to complete your personal care plan. Depending upon the condition, your plan could have included both over the counter or prescription medications.  Please review your pharmacy choice. Make sure the pharmacy is open so you can pick up prescription now. If there is a problem, you may  contact your provider through [employer] and have the prescription routed to another pharmacy.  Your safety is important to us . If you have drug allergies check your prescription carefully.   For the next 24 hours you can use MyChart to ask questions about today's visit, request a non-urgent call back, or ask for a work or school excuse. You will get an email in the next two days asking about your experience. I hope that your e-visit has been valuable and will speed your recovery.     I have spent 5 minutes in review of e-visit questionnaire, review and updating patient chart, medical decision making and response to patient.   Delon CHRISTELLA Dickinson, PA-C

## 2024-03-11 ENCOUNTER — Ambulatory Visit: Attending: Obstetrics and Gynecology | Admitting: Maternal & Fetal Medicine

## 2024-03-11 ENCOUNTER — Ambulatory Visit

## 2024-03-11 ENCOUNTER — Other Ambulatory Visit: Payer: Self-pay | Admitting: *Deleted

## 2024-03-11 VITALS — BP 105/58 | HR 85

## 2024-03-11 DIAGNOSIS — Z3A21 21 weeks gestation of pregnancy: Secondary | ICD-10-CM | POA: Diagnosis not present

## 2024-03-11 DIAGNOSIS — Z3480 Encounter for supervision of other normal pregnancy, unspecified trimester: Secondary | ICD-10-CM | POA: Diagnosis not present

## 2024-03-11 DIAGNOSIS — Z3689 Encounter for other specified antenatal screening: Secondary | ICD-10-CM | POA: Diagnosis not present

## 2024-03-11 DIAGNOSIS — Z363 Encounter for antenatal screening for malformations: Secondary | ICD-10-CM | POA: Insufficient documentation

## 2024-03-11 DIAGNOSIS — Z362 Encounter for other antenatal screening follow-up: Secondary | ICD-10-CM

## 2024-03-11 NOTE — Progress Notes (Signed)
 Patient information  Patient Name: [name] Schaefer  Patient MRN:   [id number]  Referring practice: MFM Referring Provider: [facility]  Problem List   Patient Active Problem List   Diagnosis Date Noted   LGSIL on Pap smear of cervix 01/14/2024   Elevated blood pressure reading 01/06/2024   Supervision of other normal pregnancy, antepartum 12/09/2023   UTI (urinary tract infection) 11/11/2023   Migraine 10/14/2023   Maternal Fetal Medicine Consult [name] Schaefer is a 28 y.o. H3E7967 at [redacted]w[redacted]d here for ultrasound and consultation. She had Low risk aneuploidy screening of a female fetus. Carrier screening was Negative for the basic screening (SMA, alpha-thal, beta-thal, and cystic fibroisis. Maternal serum AFP was negative. She has no acute concerns.    I reassured the patient that we expect a favorable pregnancy outcome but due to her pregnancy complications she will need a higher level of monitoring for her pregnancy compared to a pregnancy without complications. The patient had time to ask questions that were answered to her satisfaction. She verbalized understanding of our discussion and agreed to proceed with the plan outlined in the recommendations.   Sonographic findings Single intrauterine pregnancy at 21w 2d  Fetal cardiac activity:  Observed and appears normal. Presentation: Breech. The anatomic structures that were well seen appear normal without evidence of soft markers. Due to poor acoustic windows some structures remain suboptimally visualized. Fetal biometry shows the estimated fetal weight at the 28 percentile.  Amniotic fluid: Within normal limits.  MVP: 5.86 cm. Placenta: Posterior. Adnexa: No abnormality visualized. Cervical length: 3.1 cm.  There are limitations of prenatal ultrasound such as the inability to detect certain abnormalities due to poor visualization. Various factors such as fetal position, gestational age and maternal body habitus may  increase the difficulty in visualizing the fetal anatomy.    Recommendations - EDD should be 07/20/2024 based on  Early Ultrasound  (12/09/23). - Anatomy ultrasound was done today with the above findings (see report). - F/u in 4 weeks for anatomy not well visualized.  Review of Systems: A review of systems was performed and was negative except per HPI   Past Obstetrical History:  OB History  Gravida Para Term Preterm AB Living  6 2 2  0 3 2  SAB IAB Ectopic Multiple Live Births  1 2 0 0 2    # Outcome Date GA Lbr Len/2nd Weight Sex Type Anes PTL Lv  6 Current           5 Term 09/17/17 [redacted]w[redacted]d 07:45 / 00:25 6 lb 11.2 oz (3.039 kg) M Vag-Spont EPI  LIV  4 Term 11/27/14 [redacted]w[redacted]d 09:17 / 00:30 6 lb 6.8 oz (2.914 kg) M Vag-Spont EPI  LIV     Birth Comments: WNL  3 IAB 07/2013          2 SAB           1 IAB              Past Medical History:  Past Medical History:  Diagnosis Date   Anxiety    Depression      Past Surgical History:    Past Surgical History:  Procedure Laterality Date   INDUCED ABORTION       Home Medications:   Current Outpatient Medications on File Prior to Visit  Medication Sig Dispense Refill   cephALEXin  (KEFLEX ) 500 MG capsule Take 1 capsule (500 mg total) by mouth 2 (two) times daily. 14 capsule 0  Prenatal Vit-Fe Phos-FA-Omega (VITAFOL  GUMMIES) 3.33-0.333-34.8 MG CHEW Chew 1 tablet by mouth daily. 90 tablet 5   Blood Pressure Monitoring (BLOOD PRESSURE KIT) DEVI 1 Device by Does not apply route once a week. (Patient not taking: Reported on 03/02/2024) 1 each 0   terconazole  (TERAZOL 7 ) 0.4 % vaginal cream Place 1 applicator vaginally at bedtime. (Patient not taking: Reported on 03/02/2024) 45 g 0   No current facility-administered medications on file prior to visit.      Allergies:   No Known Allergies   Physical Exam:   Vitals:   03/11/24 0719  BP: (!) 105/58  Pulse: 85   Sitting comfortably on the sonogram table Nonlabored breathing Normal  rate and rhythm Abdomen is nontender  Thank you for the opportunity to be involved with this patient's care. Please let us  know if we can be of any further assistance.   30 minutes of time was spent reviewing the patient's chart including labs, imaging and documentation.  At least 50% of this time was spent with direct patient care discussing the diagnosis, management and prognosis of her care.  Chuck [name] Schaefer MFM, [place]   03/11/2024  1:33 PM

## 2024-03-28 ENCOUNTER — Telehealth: Admitting: Family

## 2024-03-28 DIAGNOSIS — N898 Other specified noninflammatory disorders of vagina: Secondary | ICD-10-CM

## 2024-03-28 DIAGNOSIS — Z3490 Encounter for supervision of normal pregnancy, unspecified, unspecified trimester: Secondary | ICD-10-CM

## 2024-03-28 NOTE — Progress Notes (Signed)
  Because you are pregnant and having yellow vaginal dishcarge, I feel your condition warrants further evaluation and I recommend that you be seen in a face-to-face visit.   NOTE: There will be NO CHARGE for this E-Visit   If you are having a true medical emergency, please call 911.     For an urgent face to face visit, [facility] has multiple urgent care centers for your convenience.  Click the link below for the full list of locations and hours, walk-in wait times, appointment scheduling options and driving directions:  Urgent Care - [place], [place], [place], [place], [place], [place]  [place]     Your MyChart E-visit questionnaire answers were reviewed by a board certified advanced clinical practitioner to complete your personal care plan based on your specific symptoms.    Thank you for using e-Visits.

## 2024-03-30 ENCOUNTER — Other Ambulatory Visit (HOSPITAL_COMMUNITY)
Admission: RE | Admit: 2024-03-30 | Discharge: 2024-03-30 | Disposition: A | Discharge status: Home / Self Care | Source: Ambulatory Visit | Attending: Obstetrics and Gynecology | Admitting: Obstetrics and Gynecology

## 2024-03-30 ENCOUNTER — Ambulatory Visit: Admitting: Obstetrics and Gynecology

## 2024-03-30 VITALS — BP 117/83 | HR 86 | Wt 161.6 lb

## 2024-03-30 DIAGNOSIS — O26892 Other specified pregnancy related conditions, second trimester: Secondary | ICD-10-CM | POA: Diagnosis not present

## 2024-03-30 DIAGNOSIS — Z3480 Encounter for supervision of other normal pregnancy, unspecified trimester: Secondary | ICD-10-CM

## 2024-03-30 DIAGNOSIS — N898 Other specified noninflammatory disorders of vagina: Secondary | ICD-10-CM

## 2024-03-30 DIAGNOSIS — O2342 Unspecified infection of urinary tract in pregnancy, second trimester: Secondary | ICD-10-CM | POA: Diagnosis not present

## 2024-03-30 DIAGNOSIS — Z3A24 24 weeks gestation of pregnancy: Secondary | ICD-10-CM

## 2024-03-30 LAB — POCT URINALYSIS DIPSTICK
Bilirubin, UA: NEGATIVE
Blood, UA: NEGATIVE
Glucose, UA: NEGATIVE
Ketones, UA: NEGATIVE
Nitrite, UA: NEGATIVE
Protein, UA: POSITIVE — AB
Spec Grav, UA: 1.015 (ref 1.010–1.025)
Urobilinogen, UA: 0.2 U/dL
pH, UA: 7.5 (ref 5.0–8.0)

## 2024-03-30 MED ORDER — CEFADROXIL 500 MG PO CAPS: 500.0000 mg | ORAL_CAPSULE | Freq: Two times a day (BID) | ORAL | 0 refills | Status: DC

## 2024-03-30 NOTE — Progress Notes (Signed)
 Pt presents for ROB. Having low back back, thick, [place] discharge ~ 1 week. Urinalysis dipstick shows moderate leukocytes, trace of protein.

## 2024-03-30 NOTE — Progress Notes (Signed)
   PRENATAL VISIT NOTE  Subjective:  [name] Schaefer is a 28 y.o. [id number] at [redacted]w[redacted]d being seen today for ongoing prenatal care.  She is currently monitored for the following issues for this low-risk pregnancy and has UTI (urinary tract infection); Migraine; Supervision of other normal pregnancy, antepartum; Elevated blood pressure reading; and LGSIL on Pap smear of cervix on their problem list.  Patient doing well with no acute concerns today. She reports backache.  Contractions: Not present. Vag. Bleeding: None.  Movement: Present. Denies leaking of fluid.  Pt denies fever/chills.  The following portions of the patient's history were reviewed and updated as appropriate: allergies, current medications, past family history, past medical history, past social history, past surgical history and problem list. Problem list updated.  Objective:   Vitals:   03/30/24 1013  BP: 117/83  Pulse: 86  Weight: 161 lb 9.6 oz (73.3 kg)    Fetal Status: Fetal Heart Rate (bpm): 140 Fundal Height: 24 cm Movement: Present     General:  Alert, oriented and cooperative. Patient is in no acute distress.  Skin: Skin is warm and dry. No rash noted.   Cardiovascular: Normal heart rate noted  Respiratory: Normal respiratory effort, no problems with respiration noted  Abdomen: Soft, gravid, appropriate for gestational age.  Pain/Pressure: Absent     Pelvic: Cervical exam deferred        Extremities: Normal range of motion.  Edema: None  Mental Status:  Normal mood and affect. Normal behavior. Normal judgment and thought content.  Negative CVA tenderness bilaterally Assessment and Plan:  Pregnancy: [id number] at [redacted]w[redacted]d  1. Supervision of other normal pregnancy, antepartum (Primary) Continue routine prenatal care  - POCT Urinalysis Dipstick  2. [redacted] weeks gestation of pregnancy   3. Urinary tract infection in mother during second trimester of pregnancy UA suspicious for potential UTI, urine culture pending,  will treat empirically  - Culture, OB Urine - cefadroxil  (DURICEF) 500 MG capsule; Take 1 capsule (500 mg total) by mouth 2 (two) times daily.  Dispense: 14 capsule; Refill: 0  4. Vaginal discharge during pregnancy in second trimester Self swab obtained - Cervicovaginal ancillary only  Preterm labor symptoms and general obstetric precautions including but not limited to vaginal bleeding, contractions, leaking of fluid and fetal movement were reviewed in detail with the patient.  Please refer to After Visit Summary for other counseling recommendations.   Return in about 4 weeks (around 04/27/2024) for ROB, in person, 2 hr GTT, 3rd trim labs.   Jerilynn Buddle, MD Faculty Attending Center for [facility]

## 2024-03-31 ENCOUNTER — Ambulatory Visit: Payer: Self-pay | Admitting: Obstetrics and Gynecology

## 2024-03-31 LAB — CERVICOVAGINAL ANCILLARY ONLY
Bacterial Vaginitis (gardnerella): POSITIVE — AB
Candida Glabrata: NEGATIVE
Candida Vaginitis: POSITIVE — AB
Chlamydia: NEGATIVE
Comment: NEGATIVE
Comment: NEGATIVE
Comment: NEGATIVE
Comment: NEGATIVE
Comment: NEGATIVE
Comment: NORMAL
Neisseria Gonorrhea: NEGATIVE
Trichomonas: NEGATIVE

## 2024-04-01 LAB — URINE CULTURE, OB REFLEX

## 2024-04-01 LAB — CULTURE, OB URINE

## 2024-04-05 ENCOUNTER — Other Ambulatory Visit: Payer: Self-pay

## 2024-04-05 DIAGNOSIS — B3731 Acute candidiasis of vulva and vagina: Secondary | ICD-10-CM

## 2024-04-05 DIAGNOSIS — B9689 Other specified bacterial agents as the cause of diseases classified elsewhere: Secondary | ICD-10-CM

## 2024-04-05 MED ORDER — METRONIDAZOLE 500 MG PO TABS: 500.0000 mg | ORAL_TABLET | Freq: Two times a day (BID) | ORAL | 0 refills | Status: DC

## 2024-04-05 MED ORDER — TERCONAZOLE 0.4 % VA CREA: 1.0000 | TOPICAL_CREAM | Freq: Every day | VAGINAL | 0 refills | Status: DC

## 2024-04-15 ENCOUNTER — Ambulatory Visit (HOSPITAL_BASED_OUTPATIENT_CLINIC_OR_DEPARTMENT_OTHER)

## 2024-04-15 ENCOUNTER — Ambulatory Visit: Attending: Maternal & Fetal Medicine | Admitting: Obstetrics

## 2024-04-15 VITALS — BP 119/68 | HR 74

## 2024-04-15 DIAGNOSIS — Z362 Encounter for other antenatal screening follow-up: Secondary | ICD-10-CM | POA: Insufficient documentation

## 2024-04-15 DIAGNOSIS — Z3A26 26 weeks gestation of pregnancy: Secondary | ICD-10-CM | POA: Insufficient documentation

## 2024-04-15 NOTE — Progress Notes (Signed)
 MFM Note  [name] Schaefer is currently at 26 weeks and 2 days.  She was seen as the views of the fetal anatomy were unable to be fully visualized during her last exam.  She denies any problems since her last exam.  On today's exam, the overall EFW of 2 pounds measures at the 31st percentile for her gestational age.    There was normal amniotic fluid noted.  The views of the fetal anatomy were visualized today.  There were no obvious anomalies noted.    The limitations of ultrasound in the detection of all anomalies was discussed.    Follow-up as indicated.

## 2024-04-27 ENCOUNTER — Encounter: Admitting: Certified Nurse Midwife

## 2024-04-27 ENCOUNTER — Other Ambulatory Visit

## 2024-04-30 ENCOUNTER — Other Ambulatory Visit

## 2024-04-30 DIAGNOSIS — Z3A28 28 weeks gestation of pregnancy: Secondary | ICD-10-CM

## 2024-04-30 DIAGNOSIS — Z3480 Encounter for supervision of other normal pregnancy, unspecified trimester: Secondary | ICD-10-CM

## 2024-05-01 LAB — HEPATITIS C ANTIBODY: Hep C Virus Ab: NONREACTIVE

## 2024-05-01 LAB — HIV ANTIBODY (ROUTINE TESTING W REFLEX): HIV Screen 4th Generation wRfx: NONREACTIVE

## 2024-05-01 LAB — GLUCOSE TOLERANCE, 2 HOURS W/ 1HR
Glucose, 1 hour: 129 mg/dL (ref 70–179)
Glucose, 2 hour: 87 mg/dL (ref 70–152)
Glucose, Fasting: 77 mg/dL (ref 70–91)

## 2024-05-01 LAB — RPR: RPR Ser Ql: NONREACTIVE

## 2024-05-01 LAB — HEPATITIS B SURFACE ANTIGEN: Hepatitis B Surface Ag: NEGATIVE

## 2024-05-05 ENCOUNTER — Ambulatory Visit: Payer: Self-pay | Admitting: Obstetrics and Gynecology

## 2024-05-05 ENCOUNTER — Ambulatory Visit (INDEPENDENT_AMBULATORY_CARE_PROVIDER_SITE_OTHER): Admitting: Obstetrics

## 2024-05-05 VITALS — BP 120/84 | HR 80 | Wt 162.0 lb

## 2024-05-05 DIAGNOSIS — R87612 Low grade squamous intraepithelial lesion on cytologic smear of cervix (LGSIL): Secondary | ICD-10-CM

## 2024-05-05 DIAGNOSIS — Z3480 Encounter for supervision of other normal pregnancy, unspecified trimester: Secondary | ICD-10-CM

## 2024-05-05 DIAGNOSIS — R030 Elevated blood-pressure reading, without diagnosis of hypertension: Secondary | ICD-10-CM

## 2024-05-05 NOTE — Progress Notes (Signed)
 Subjective:  [name] Schaefer is a 28 y.o. H3E7967 at [redacted]w[redacted]d being seen today for ongoing prenatal care.  She is currently monitored for the following issues for this low-risk pregnancy and has UTI (urinary tract infection); Migraine; Supervision of other normal pregnancy, antepartum; Elevated blood pressure reading; and LGSIL on Pap smear of cervix on their problem list.  Patient reports heartburn.  Contractions: Not present. Vag. Bleeding: None.  Movement: Present. Denies leaking of fluid.   The following portions of the patient's history were reviewed and updated as appropriate: allergies, current medications, past family history, past medical history, past social history, past surgical history and problem list. Problem list updated.  Objective:   Vitals:   05/05/24 1448  BP: 120/84  Pulse: 80  Weight: 162 lb (73.5 kg)    Fetal Status: Fetal Heart Rate (bpm): 170   Movement: Present     General:  Alert, oriented and cooperative. Patient is in no acute distress.  Skin: Skin is warm and dry. No rash noted.   Cardiovascular: Normal heart rate noted  Respiratory: Normal respiratory effort, no problems with respiration noted  Abdomen: Soft, gravid, appropriate for gestational age. Pain/Pressure: Absent     Pelvic:  Cervical exam deferred        Extremities: Normal range of motion.  Edema: None  Mental Status: Normal mood and affect. Normal behavior. Normal judgment and thought content.   Urinalysis:      Assessment and Plan:  Pregnancy: [id number] at [redacted]w[redacted]d  1. Supervision of other normal pregnancy, antepartum (Primary)  2. Elevated blood pressure reading - BP clinically stable  3. LGSIL on Pap smear of cervix - repeat pap / colpo 3-4 months postpartum   Preterm labor symptoms and general obstetric precautions including but not limited to vaginal bleeding, contractions, leaking of fluid and fetal movement were reviewed in detail with the patient. Please refer to After Visit Summary  for other counseling recommendations.   Return in about 2 weeks (around 05/19/2024) for ROB.   Rudy Carlin LABOR, MD 05/05/2024

## 2024-05-05 NOTE — Progress Notes (Signed)
 Denies concerns today.

## 2024-05-19 ENCOUNTER — Encounter: Admitting: Obstetrics and Gynecology

## 2024-05-24 ENCOUNTER — Encounter: Payer: Self-pay | Admitting: Obstetrics

## 2024-05-24 ENCOUNTER — Ambulatory Visit: Admitting: Obstetrics

## 2024-05-24 VITALS — BP 129/77 | HR 87 | Wt 163.0 lb

## 2024-05-24 DIAGNOSIS — Z3480 Encounter for supervision of other normal pregnancy, unspecified trimester: Secondary | ICD-10-CM | POA: Diagnosis not present

## 2024-05-24 DIAGNOSIS — G43009 Migraine without aura, not intractable, without status migrainosus: Secondary | ICD-10-CM | POA: Diagnosis not present

## 2024-05-24 DIAGNOSIS — R87612 Low grade squamous intraepithelial lesion on cytologic smear of cervix (LGSIL): Secondary | ICD-10-CM | POA: Diagnosis not present

## 2024-05-24 DIAGNOSIS — R030 Elevated blood-pressure reading, without diagnosis of hypertension: Secondary | ICD-10-CM

## 2024-05-24 NOTE — Progress Notes (Signed)
 Subjective:  [name] Schaefer is a 28 y.o. [id number] at [redacted]w[redacted]d being seen today for ongoing prenatal care.  She is currently monitored for the following issues for this low-risk pregnancy and has UTI (urinary tract infection); Migraine; Supervision of other normal pregnancy, antepartum; Elevated blood pressure reading; and LGSIL on Pap smear of cervix on their problem list.  Patient reports heartburn.  Contractions: Not present. Vag. Bleeding: None.  Movement: Present. Denies leaking of fluid.   The following portions of the patient's history were reviewed and updated as appropriate: allergies, current medications, past family history, past medical history, past social history, past surgical history and problem list. Problem list updated.  Objective:   Vitals:   05/24/24 1458  BP: 129/77  Pulse: 87  Weight: 163 lb (73.9 kg)    Fetal Status: Fetal Heart Rate (bpm): 136   Movement: Present     General:  Alert, oriented and cooperative. Patient is in no acute distress.  Skin: Skin is warm and dry. No rash noted.   Cardiovascular: Normal heart rate noted  Respiratory: Normal respiratory effort, no problems with respiration noted  Abdomen: Soft, gravid, appropriate for gestational age. Pain/Pressure: Absent     Pelvic:  Cervical exam deferred        Extremities: Normal range of motion.  Edema: None  Mental Status: Normal mood and affect. Normal behavior. Normal judgment and thought content.   Urinalysis:      Assessment and Plan:  Pregnancy: [id number] at [redacted]w[redacted]d  1. Supervision of other normal pregnancy, antepartum (Primary)  2. Elevated blood pressure reading - BP clinically stable  3. LGSIL on Pap smear of cervix - colposcopy 3-4 months PP  4. Migraine without aura and without status migrainosus, not intractable - clinically stable    Preterm labor symptoms and general obstetric precautions including but not limited to vaginal bleeding, contractions, leaking of fluid and fetal  movement were reviewed in detail with the patient. Please refer to After Visit Summary for other counseling recommendations.   Return in about 2 weeks (around 06/07/2024) for ROB.   Rudy Carlin LABOR, MD 05/24/2024

## 2024-05-24 NOTE — Progress Notes (Signed)
 Denies concerns today.

## 2024-06-07 ENCOUNTER — Ambulatory Visit: Admitting: Obstetrics

## 2024-06-07 ENCOUNTER — Encounter: Payer: Self-pay | Admitting: Obstetrics

## 2024-06-07 VITALS — BP 131/89 | HR 79 | Wt 172.3 lb

## 2024-06-07 DIAGNOSIS — Z3480 Encounter for supervision of other normal pregnancy, unspecified trimester: Secondary | ICD-10-CM

## 2024-06-07 DIAGNOSIS — R030 Elevated blood-pressure reading, without diagnosis of hypertension: Secondary | ICD-10-CM

## 2024-06-07 DIAGNOSIS — N390 Urinary tract infection, site not specified: Secondary | ICD-10-CM | POA: Diagnosis not present

## 2024-06-07 DIAGNOSIS — R87612 Low grade squamous intraepithelial lesion on cytologic smear of cervix (LGSIL): Secondary | ICD-10-CM

## 2024-06-07 DIAGNOSIS — R319 Hematuria, unspecified: Secondary | ICD-10-CM

## 2024-06-07 NOTE — Progress Notes (Signed)
 Pt presents for rob. Pt has no questions or concerns at this time.

## 2024-06-07 NOTE — Progress Notes (Addendum)
 Subjective:  [name] Schaefer is a 28 y.o. H3E7967 at [redacted]w[redacted]d being seen today for ongoing prenatal care.  She is currently monitored for the following issues for this low-risk pregnancy and has UTI (urinary tract infection); Migraine; Supervision of other normal pregnancy, antepartum; Elevated blood pressure reading; and LGSIL on Pap smear of cervix on their problem list.  Patient reports backache.   .  .   . Denies leaking of fluid.   The following portions of the patient's history were reviewed and updated as appropriate: allergies, current medications, past family history, past medical history, past social history, past surgical history and problem list. Problem list updated.  Objective:  There were no vitals filed for this visit.  Fetal Status:           General:  Alert, oriented and cooperative. Patient is in no acute distress.  Skin: Skin is warm and dry. No rash noted.   Cardiovascular: Normal heart rate noted  Respiratory: Normal respiratory effort, no problems with respiration noted  Abdomen: Soft, gravid, appropriate for gestational age.       Pelvic:  Cervical exam deferred        Extremities: Normal range of motion.     Mental Status: Normal mood and affect. Normal behavior. Normal judgment and thought content.   Urinalysis:      Assessment and Plan:  Pregnancy: H3E7967 at [redacted]w[redacted]d  1. Supervision of other normal pregnancy, antepartum (Primary)  2. Elevated blood pressure reading - BP clinically stable since the spurious elevation at [redacted] weeks gestation  3. LGSIL on Pap smear of cervix - colposcopy 3-4 months postpartum  4. Urinary tract infection with hematuria, site unspecified, treated - TOC at 36 weeks - treat in labor    Preterm labor symptoms and general obstetric precautions including but not limited to vaginal bleeding, contractions, leaking of fluid and fetal movement were reviewed in detail with the patient. Please refer to After Visit Summary for other  counseling recommendations.   Return in about 2 weeks (around 06/21/2024) for ROB.   Rudy Carlin LABOR, MD 06/07/2024

## 2024-06-11 ENCOUNTER — Telehealth: Admitting: Family Medicine

## 2024-06-11 DIAGNOSIS — B3731 Acute candidiasis of vulva and vagina: Secondary | ICD-10-CM

## 2024-06-11 MED ORDER — FLUCONAZOLE 150 MG PO TABS: 150.0000 mg | ORAL_TABLET | Freq: Every day | ORAL | 0 refills | Status: AC

## 2024-06-11 NOTE — Progress Notes (Signed)

## 2024-06-20 ENCOUNTER — Inpatient Hospital Stay (HOSPITAL_COMMUNITY): Admitting: Anesthesiology

## 2024-06-20 ENCOUNTER — Encounter (HOSPITAL_COMMUNITY): Payer: Self-pay | Admitting: Obstetrics and Gynecology

## 2024-06-20 ENCOUNTER — Inpatient Hospital Stay (HOSPITAL_COMMUNITY)
Admission: AD | Admit: 2024-06-20 | Discharge: 2024-06-24 | DRG: 806 | Disposition: A | Attending: Obstetrics and Gynecology | Admitting: Obstetrics and Gynecology

## 2024-06-20 ENCOUNTER — Other Ambulatory Visit: Payer: Self-pay

## 2024-06-20 DIAGNOSIS — D62 Acute posthemorrhagic anemia: Secondary | ICD-10-CM | POA: Diagnosis not present

## 2024-06-20 DIAGNOSIS — Z3A35 35 weeks gestation of pregnancy: Secondary | ICD-10-CM | POA: Diagnosis not present

## 2024-06-20 DIAGNOSIS — Z30017 Encounter for initial prescription of implantable subdermal contraceptive: Secondary | ICD-10-CM | POA: Diagnosis not present

## 2024-06-20 DIAGNOSIS — O9081 Anemia of the puerperium: Secondary | ICD-10-CM | POA: Diagnosis not present

## 2024-06-20 DIAGNOSIS — O1414 Severe pre-eclampsia complicating childbirth: Principal | ICD-10-CM | POA: Diagnosis present

## 2024-06-20 DIAGNOSIS — O1413 Severe pre-eclampsia, third trimester: Secondary | ICD-10-CM | POA: Diagnosis present

## 2024-06-20 DIAGNOSIS — O26893 Other specified pregnancy related conditions, third trimester: Secondary | ICD-10-CM | POA: Diagnosis present

## 2024-06-20 DIAGNOSIS — G43909 Migraine, unspecified, not intractable, without status migrainosus: Secondary | ICD-10-CM | POA: Diagnosis present

## 2024-06-20 DIAGNOSIS — Z8249 Family history of ischemic heart disease and other diseases of the circulatory system: Secondary | ICD-10-CM

## 2024-06-20 DIAGNOSIS — Z833 Family history of diabetes mellitus: Secondary | ICD-10-CM | POA: Diagnosis not present

## 2024-06-20 DIAGNOSIS — Z3490 Encounter for supervision of normal pregnancy, unspecified, unspecified trimester: Secondary | ICD-10-CM

## 2024-06-20 DIAGNOSIS — Z3480 Encounter for supervision of other normal pregnancy, unspecified trimester: Principal | ICD-10-CM

## 2024-06-20 DIAGNOSIS — O1410 Severe pre-eclampsia, unspecified trimester: Secondary | ICD-10-CM | POA: Diagnosis present

## 2024-06-20 LAB — CBC
HCT: 34.9 % — ABNORMAL LOW (ref 36.0–46.0)
HCT: 37 % (ref 36.0–46.0)
HCT: 37.5 % (ref 36.0–46.0)
Hemoglobin: 11.8 g/dL — ABNORMAL LOW (ref 12.0–15.0)
Hemoglobin: 12.7 g/dL (ref 12.0–15.0)
Hemoglobin: 12.7 g/dL (ref 12.0–15.0)
MCH: 29.1 pg (ref 26.0–34.0)
MCH: 29.1 pg (ref 26.0–34.0)
MCH: 28.8 pg (ref 26.0–34.0)
MCHC: 33.8 g/dL (ref 30.0–36.0)
MCHC: 34.3 g/dL (ref 30.0–36.0)
MCHC: 33.9 g/dL (ref 30.0–36.0)
MCV: 86 fL (ref 80.0–100.0)
MCV: 84.7 fL (ref 80.0–100.0)
MCV: 85 fL (ref 80.0–100.0)
Platelets: 184 K/uL (ref 150–400)
Platelets: 192 K/uL (ref 150–400)
Platelets: 193 K/uL (ref 150–400)
RBC: 4.06 MIL/uL (ref 3.87–5.11)
RBC: 4.37 MIL/uL (ref 3.87–5.11)
RBC: 4.41 MIL/uL (ref 3.87–5.11)
RDW: 12.9 % (ref 11.5–15.5)
RDW: 12.8 % (ref 11.5–15.5)
RDW: 13 % (ref 11.5–15.5)
WBC: 5.9 K/uL (ref 4.0–10.5)
WBC: 7.5 K/uL (ref 4.0–10.5)
WBC: 8.2 K/uL (ref 4.0–10.5)
nRBC: 0 % (ref 0.0–0.2)
nRBC: 0 % (ref 0.0–0.2)
nRBC: 0 % (ref 0.0–0.2)

## 2024-06-20 LAB — RPR: RPR Ser Ql: NONREACTIVE

## 2024-06-20 LAB — COMPREHENSIVE METABOLIC PANEL WITH GFR
ALT: 9 U/L (ref 0–44)
ALT: 8 U/L (ref 0–44)
ALT: 9 U/L (ref 0–44)
AST: 14 U/L — ABNORMAL LOW (ref 15–41)
AST: 13 U/L — ABNORMAL LOW (ref 15–41)
AST: 15 U/L (ref 15–41)
Albumin: 2.5 g/dL — ABNORMAL LOW (ref 3.5–5.0)
Albumin: 2.6 g/dL — ABNORMAL LOW (ref 3.5–5.0)
Albumin: 2.5 g/dL — ABNORMAL LOW (ref 3.5–5.0)
Alkaline Phosphatase: 207 U/L — ABNORMAL HIGH (ref 38–126)
Alkaline Phosphatase: 221 U/L — ABNORMAL HIGH (ref 38–126)
Alkaline Phosphatase: 244 U/L — ABNORMAL HIGH (ref 38–126)
Anion gap: 11 (ref 5–15)
Anion gap: 9 (ref 5–15)
Anion gap: 10 (ref 5–15)
BUN: 8 mg/dL (ref 6–20)
BUN: 7 mg/dL (ref 6–20)
BUN: 5 mg/dL — ABNORMAL LOW (ref 6–20)
CO2: 21 mmol/L — ABNORMAL LOW (ref 22–32)
CO2: 22 mmol/L (ref 22–32)
CO2: 21 mmol/L — ABNORMAL LOW (ref 22–32)
Calcium: 8.1 mg/dL — ABNORMAL LOW (ref 8.9–10.3)
Calcium: 8.3 mg/dL — ABNORMAL LOW (ref 8.9–10.3)
Calcium: 7.7 mg/dL — ABNORMAL LOW (ref 8.9–10.3)
Chloride: 104 mmol/L (ref 98–111)
Chloride: 104 mmol/L (ref 98–111)
Chloride: 103 mmol/L (ref 98–111)
Creatinine, Ser: 0.68 mg/dL (ref 0.44–1.00)
Creatinine, Ser: 0.67 mg/dL (ref 0.44–1.00)
Creatinine, Ser: 0.62 mg/dL (ref 0.44–1.00)
GFR, Estimated: >60 mL/min (ref >60)
GFR, Estimated: >60 mL/min (ref >60)
GFR, Estimated: >60 mL/min (ref >60)
Glucose, Bld: 108 mg/dL — ABNORMAL HIGH (ref 70–99)
Glucose, Bld: 79 mg/dL (ref 70–99)
Glucose, Bld: 91 mg/dL (ref 70–99)
Potassium: 3.8 mmol/L (ref 3.5–5.1)
Potassium: 4.3 mmol/L (ref 3.5–5.1)
Potassium: 3.7 mmol/L (ref 3.5–5.1)
Sodium: 136 mmol/L (ref 135–145)
Sodium: 135 mmol/L (ref 135–145)
Sodium: 134 mmol/L — ABNORMAL LOW (ref 135–145)
Total Bilirubin: 0.3 mg/dL (ref 0.0–1.2)
Total Bilirubin: 0.6 mg/dL (ref 0.0–1.2)
Total Bilirubin: 0.5 mg/dL (ref 0.0–1.2)
Total Protein: 6.2 g/dL — ABNORMAL LOW (ref 6.5–8.1)
Total Protein: 6.3 g/dL — ABNORMAL LOW (ref 6.5–8.1)
Total Protein: 6.7 g/dL (ref 6.5–8.1)

## 2024-06-20 LAB — TYPE AND SCREEN
ABO/RH(D): A POS
Antibody Screen: NEGATIVE

## 2024-06-20 LAB — URINALYSIS, ROUTINE W REFLEX MICROSCOPIC
Bilirubin Urine: NEGATIVE
Glucose, UA: NEGATIVE mg/dL
Hgb urine dipstick: NEGATIVE
Ketones, ur: NEGATIVE mg/dL
Nitrite: NEGATIVE
Protein, ur: 100 mg/dL — AB
Specific Gravity, Urine: 1.021 (ref 1.005–1.030)
pH: 5 (ref 5.0–8.0)

## 2024-06-20 LAB — MAGNESIUM
Magnesium: 1.8 mg/dL (ref 1.7–2.4)
Magnesium: 4.7 mg/dL — ABNORMAL HIGH (ref 1.7–2.4)

## 2024-06-20 LAB — PROTEIN / CREATININE RATIO, URINE
Creatinine, Urine: 289 mg/dL
Protein Creatinine Ratio: 0.23 mg/mg{creat} — ABNORMAL HIGH (ref 0.00–0.15)
Total Protein, Urine: 66 mg/dL

## 2024-06-20 LAB — GROUP B STREP BY PCR: Group B strep by PCR: NEGATIVE

## 2024-06-20 MED ORDER — LACTATED RINGERS IV SOLN: INTRAVENOUS | Status: DC

## 2024-06-20 MED ORDER — LACTATED RINGERS IV SOLN: 500.0000 mL | INTRAVENOUS | Status: DC | PRN

## 2024-06-20 MED ORDER — NIFEDIPINE ER OSMOTIC RELEASE 30 MG PO TB24
30.0000 mg | ORAL_TABLET | Freq: Once | ORAL | Status: AC
  Administered 2024-06-20: 30 mg via ORAL
  Filled 2024-06-20: qty 1

## 2024-06-20 MED ORDER — ONDANSETRON HCL 4 MG/2ML IJ SOLN
4.0000 mg | Freq: Four times a day (QID) | INTRAMUSCULAR | Status: DC | PRN
  Administered 2024-06-21: 4 mg via INTRAVENOUS
  Filled 2024-06-20: qty 2

## 2024-06-20 MED ORDER — HYDROXYZINE HCL 50 MG PO TABS: 50.0000 mg | ORAL_TABLET | Freq: Four times a day (QID) | ORAL | Status: DC | PRN

## 2024-06-20 MED ORDER — SODIUM CHLORIDE 0.9% FLUSH: 3.0000 mL | INTRAVENOUS | Status: DC | PRN

## 2024-06-20 MED ORDER — EPHEDRINE 5 MG/ML INJ: 10.0000 mg | INTRAVENOUS | Status: DC | PRN

## 2024-06-20 MED ORDER — ACETAMINOPHEN 325 MG PO TABS: 650.0000 mg | ORAL_TABLET | ORAL | Status: DC | PRN

## 2024-06-20 MED ORDER — HYDRALAZINE HCL 20 MG/ML IJ SOLN
10.0000 mg | INTRAMUSCULAR | Status: DC | PRN
Start: 2024-06-20 — End: 2024-06-24

## 2024-06-20 MED ORDER — LIDOCAINE-EPINEPHRINE (PF) 2 %-1:200000 IJ SOLN
INTRAMUSCULAR | Status: DC | PRN
  Administered 2024-06-20: 3 mL via EPIDURAL

## 2024-06-20 MED ORDER — SOD CITRATE-CITRIC ACID 500-334 MG/5ML PO SOLN: 30.0000 mL | ORAL | Status: DC | PRN

## 2024-06-20 MED ORDER — OXYCODONE-ACETAMINOPHEN 5-325 MG PO TABS: 1.0000 | ORAL_TABLET | ORAL | Status: DC | PRN

## 2024-06-20 MED ORDER — PENICILLIN G POT IN DEXTROSE 60000 UNIT/ML IV SOLN
3.0000 10*6.[IU] | INTRAVENOUS | Status: DC
  Administered 2024-06-21: 3 10*6.[IU] via INTRAVENOUS
  Filled 2024-06-20: qty 50

## 2024-06-20 MED ORDER — LIDOCAINE HCL (PF) 1 % IJ SOLN: 30.0000 mL | INTRAMUSCULAR | Status: DC | PRN

## 2024-06-20 MED ORDER — OXYTOCIN-SODIUM CHLORIDE 30-0.9 UT/500ML-% IV SOLN: 2.5000 [IU]/h | INTRAVENOUS | Status: DC

## 2024-06-20 MED ORDER — PHENYLEPHRINE 80 MCG/ML (10ML) SYRINGE FOR IV PUSH (FOR BLOOD PRESSURE SUPPORT): 80.0000 ug | PREFILLED_SYRINGE | INTRAVENOUS | Status: DC | PRN

## 2024-06-20 MED ORDER — HYDRALAZINE HCL 20 MG/ML IJ SOLN
5.0000 mg | INTRAMUSCULAR | Status: DC | PRN
  Administered 2024-06-20 (×3): 5 mg via INTRAVENOUS
  Filled 2024-06-20 (×3): qty 1

## 2024-06-20 MED ORDER — MISOPROSTOL 50MCG HALF TABLET
50.0000 ug | ORAL_TABLET | ORAL | Status: DC | PRN
  Administered 2024-06-20: 50 ug via ORAL
  Filled 2024-06-20: qty 1

## 2024-06-20 MED ORDER — FENTANYL-BUPIVACAINE-NACL 0.5-0.125-0.9 MG/250ML-% EP SOLN
12.0000 mL/h | EPIDURAL | Status: DC | PRN
  Administered 2024-06-20: 12 mL/h via EPIDURAL
  Filled 2024-06-20: qty 250

## 2024-06-20 MED ORDER — SOD CITRATE-CITRIC ACID 500-334 MG/5ML PO SOLN
30.0000 mL | ORAL | Status: DC | PRN
  Administered 2024-06-21: 30 mL via ORAL
  Filled 2024-06-20: qty 30

## 2024-06-20 MED ORDER — NIFEDIPINE ER OSMOTIC RELEASE 30 MG PO TB24
30.0000 mg | ORAL_TABLET | Freq: Two times a day (BID) | ORAL | Status: DC
  Administered 2024-06-21 – 2024-06-22 (×5): 30 mg via ORAL
  Filled 2024-06-20 (×5): qty 1

## 2024-06-20 MED ORDER — MAGNESIUM SULFATE 40 GM/1000ML IV SOLN
2.0000 g/h | INTRAVENOUS | Status: DC
  Administered 2024-06-21 (×2): 2 g/h via INTRAVENOUS
  Filled 2024-06-20 (×3): qty 1000

## 2024-06-20 MED ORDER — ACETAMINOPHEN-CAFFEINE 500-65 MG PO TABS
2.0000 | ORAL_TABLET | Freq: Once | ORAL | Status: AC
  Administered 2024-06-20: 2 via ORAL
  Filled 2024-06-20: qty 2

## 2024-06-20 MED ORDER — SODIUM CHLORIDE 0.9% FLUSH: 3.0000 mL | Freq: Two times a day (BID) | INTRAVENOUS | Status: DC

## 2024-06-20 MED ORDER — SODIUM CHLORIDE 0.9 % IV SOLN: 250.0000 mL | INTRAVENOUS | Status: DC | PRN

## 2024-06-20 MED ORDER — LACTATED RINGERS IV SOLN
500.0000 mL | Freq: Once | INTRAVENOUS | Status: AC
  Administered 2024-06-20: 500 mL via INTRAVENOUS

## 2024-06-20 MED ORDER — FENTANYL CITRATE (PF) 100 MCG/2ML IJ SOLN
50.0000 ug | INTRAMUSCULAR | Status: DC | PRN
  Administered 2024-06-21 (×2): 50 ug via INTRAVENOUS
  Filled 2024-06-20: qty 2

## 2024-06-20 MED ORDER — FENTANYL-BUPIVACAINE-NACL 0.5-0.125-0.9 MG/250ML-% EP SOLN: 12.0000 mL/h | EPIDURAL | Status: DC | PRN

## 2024-06-20 MED ORDER — LABETALOL HCL 5 MG/ML IV SOLN
40.0000 mg | INTRAVENOUS | Status: DC | PRN
Start: 2024-06-20 — End: 2024-06-24

## 2024-06-20 MED ORDER — ACETAMINOPHEN 500 MG PO TABS
1000.0000 mg | ORAL_TABLET | Freq: Four times a day (QID) | ORAL | Status: DC | PRN
  Filled 2024-06-20: qty 2

## 2024-06-20 MED ORDER — PHENYLEPHRINE 80 MCG/ML (10ML) SYRINGE FOR IV PUSH (FOR BLOOD PRESSURE SUPPORT)
80.0000 ug | PREFILLED_SYRINGE | INTRAVENOUS | Status: DC | PRN
  Filled 2024-06-20: qty 10

## 2024-06-20 MED ORDER — EPHEDRINE 5 MG/ML INJ
10.0000 mg | INTRAVENOUS | Status: DC | PRN
Start: 2024-06-20 — End: 2024-06-21

## 2024-06-20 MED ORDER — ACETAMINOPHEN 500 MG PO TABS
1000.0000 mg | ORAL_TABLET | Freq: Once | ORAL | Status: AC
  Administered 2024-06-20: 1000 mg via ORAL

## 2024-06-20 MED ORDER — LABETALOL HCL 5 MG/ML IV SOLN
20.0000 mg | INTRAVENOUS | Status: DC | PRN
Start: 2024-06-20 — End: 2024-06-24

## 2024-06-20 MED ORDER — MAGNESIUM SULFATE BOLUS VIA INFUSION
4.0000 g | Freq: Once | INTRAVENOUS | Status: AC
  Administered 2024-06-20: 4 g via INTRAVENOUS
  Filled 2024-06-20: qty 1000

## 2024-06-20 MED ORDER — ACETAMINOPHEN 500 MG PO TABS: 1000.0000 mg | ORAL_TABLET | Freq: Four times a day (QID) | ORAL | Status: DC | PRN

## 2024-06-20 MED ORDER — OXYTOCIN-SODIUM CHLORIDE 30-0.9 UT/500ML-% IV SOLN
1.0000 m[IU]/min | INTRAVENOUS | Status: DC
  Administered 2024-06-20: 2 m[IU]/min via INTRAVENOUS
  Filled 2024-06-20: qty 500

## 2024-06-20 MED ORDER — TERBUTALINE SULFATE 1 MG/ML IJ SOLN: 0.2500 mg | Freq: Once | INTRAMUSCULAR | Status: DC | PRN

## 2024-06-20 MED ORDER — DIPHENHYDRAMINE HCL 50 MG/ML IJ SOLN: 12.5000 mg | INTRAMUSCULAR | Status: DC | PRN

## 2024-06-20 MED ORDER — OXYCODONE-ACETAMINOPHEN 5-325 MG PO TABS: 2.0000 | ORAL_TABLET | ORAL | Status: DC | PRN

## 2024-06-20 MED ORDER — LACTATED RINGERS IV SOLN: 500.0000 mL | Freq: Once | INTRAVENOUS | Status: DC

## 2024-06-20 MED ORDER — CYCLOBENZAPRINE HCL 5 MG PO TABS
5.0000 mg | ORAL_TABLET | Freq: Once | ORAL | Status: AC
  Administered 2024-06-20: 5 mg via ORAL
  Filled 2024-06-20: qty 1

## 2024-06-20 MED ORDER — ONDANSETRON HCL 4 MG/2ML IJ SOLN: 4.0000 mg | Freq: Four times a day (QID) | INTRAMUSCULAR | Status: DC | PRN

## 2024-06-20 MED ORDER — SODIUM CHLORIDE 0.9 % IV SOLN
5.0000 10*6.[IU] | Freq: Once | INTRAVENOUS | Status: AC
  Administered 2024-06-20: 5 10*6.[IU] via INTRAVENOUS
  Filled 2024-06-20: qty 5

## 2024-06-20 MED ORDER — ONDANSETRON HCL 4 MG PO TABS
8.0000 mg | ORAL_TABLET | Freq: Once | ORAL | Status: AC
  Administered 2024-06-20: 8 mg via ORAL
  Filled 2024-06-20: qty 2

## 2024-06-20 MED ORDER — OXYTOCIN BOLUS FROM INFUSION: 333.0000 mL | Freq: Once | INTRAVENOUS | Status: DC

## 2024-06-20 MED ORDER — OXYTOCIN BOLUS FROM INFUSION
333.0000 mL | Freq: Once | INTRAVENOUS | Status: AC
Start: 2024-06-20 — End: 2024-06-21
  Administered 2024-06-21: 333 mL via INTRAVENOUS

## 2024-06-20 NOTE — Progress Notes (Addendum)
 Labor Progress Note [name] Schaefer is a 28 y.o. [id number] at 110w5d presented for IOL in the setting of preE with SF.   S:  Patient sitting up comfortably in bed. Since we saw her last, her foley balloon came out. Received 1g Tylenol  for her returned HA which has significantly improved the pain.   O:  BP (!) 148/99   Pulse 92   Temp 98.3 F (36.8 C) (Oral)   Resp 16   Ht 5' 1 (1.549 m)   Wt 80.5 kg   LMP  (LMP Unknown)   SpO2 100%   BMI 33.52 kg/m  EFM: 120/mod variability/ +accels, -decels  CVE: Dilation: 3 Effacement (%): 50 Cervical Position: Posterior Station: -3 Presentation: Vertex Exam by:: Dr. Danny   A&P: 28 y.o. [id number] [redacted]w[redacted]d for IOL in the setting of preE with SF.   #Labor: Progressing well. Started patient on 2x2 pitocin . Assess AROM at next check #Pain: Still tolerating, will want an epidural.  #FWB: CAT I #GBS Presumptive Negative (PCR) #PreE with SF: Continue to monitor. Patient on mag.   Abigail E Mohr, MD 5:46 PM    Attestation of Supervision of Resident: Evaluation and management procedures were performed by the learners: Family Medicine Resident under my supervision. I was immediately available for direct supervision, assistance and direction throughout this encounter.  I also confirm that I have verified the information documented in the resident's note, and that I have also personally reperformed the pertinent components of the physical exam and all of the medical decision making activities.  I have also made any necessary editorial changes.  Barabara Danny, DO FM-OB Fellow Center for [employer]

## 2024-06-20 NOTE — Anesthesia Procedure Notes (Signed)
 Epidural Patient location during procedure: OB Start time: 06/20/2024 11:45 PM End time: 06/20/2024 11:51 PM  Staffing Anesthesiologist: Tilford Franky BIRCH, MD Performed: anesthesiologist   Preanesthetic Checklist Completed: patient identified, IV checked, site marked, risks and benefits discussed, surgical consent, monitors and equipment checked, pre-op evaluation and timeout performed  Epidural Patient position: sitting Prep: DuraPrep Patient monitoring: heart rate, continuous pulse ox and blood pressure Approach: midline Location: L3-L4 Injection technique: LOR saline  Needle:  Needle type: Tuohy  Needle gauge: 17 G Needle length: 9 cm Catheter type: closed end flexible Catheter size: 20 Guage Test dose: negative and 1.5% lidocaine   Assessment Events: blood not aspirated, no cerebrospinal fluid, injection not painful, no injection resistance and no paresthesia  Additional Notes LOR @ 5.5  Patient identified. Risks/Benefits/Options discussed with patient including but not limited to bleeding, infection, nerve damage, paralysis, failed block, incomplete pain control, headache, blood pressure changes, nausea, vomiting, reactions to medications, itching and postpartum back pain. Confirmed with bedside nurse the patient's most recent platelet count. Confirmed with patient that they are not currently taking any anticoagulation, have any bleeding history or any family history of bleeding disorders. Patient expressed understanding and wished to proceed. All questions were answered. Sterile technique was used throughout the entire procedure. Please see nursing notes for vital signs. Test dose was given through epidural catheter and negative prior to continuing to dose epidural or start infusion. Warning signs of high block given to the patient including shortness of breath, tingling/numbness in hands, complete motor block, or any concerning symptoms with instructions to call for help. Patient  was given instructions on fall risk and not to get out of bed. All questions and concerns addressed with instructions to call with any issues or inadequate analgesia.    Reason for block:procedure for pain

## 2024-06-20 NOTE — MAU Note (Signed)
 [name] Schaefer is a 28 y.o. at [redacted]w[redacted]d here in MAU reporting: she's has a HA, cramping, and N/V that began last night.  Reports has vomited 3x in past 24 hours.  Denies taking med to treat HA.  Denies VB and LOF.  Endorses +FM.  LMP:  Onset of complaint: yesterday Pain score: 7 HA & 8 cramping Vitals:   06/20/24 0851  BP: (!) 141/96  Pulse: 87  Resp: 19  Temp: 98.3 F (36.8 C)  SpO2: 100%     FHT: 141 bpm  Lab orders placed from triage: UA

## 2024-06-20 NOTE — H&P (Addendum)
 OBSTETRIC ADMISSION HISTORY AND PHYSICAL  [name] Schaefer is a 28 y.o. female [phone] with IUP at [redacted]w[redacted]d by 8wk US  presenting for IOL in the setting of preE with severe features. She reports +FMs, No LOF, no VB, no blurry vision, headaches or peripheral edema, and RUQ pain.  She plans on bottle feeding. She request Nexplanon  for birth control. She received her prenatal care at [facility].   Dating: By winnie US  --->  Estimated Date of Delivery: 07/20/24  Sono:    @[redacted]w[redacted]d , CWD, normal anatomy, cephalic presentation, posterior placenta, 894g, 31% EFW   Prenatal History/Complications: None  Past Medical History: Past Medical History:  Diagnosis Date   Anxiety    Depression     Past Surgical History: Past Surgical History:  Procedure Laterality Date   INDUCED ABORTION      Obstetrical History: OB History  Gravida Para Term Preterm AB Living  6 2 2  0 3 2  SAB IAB Ectopic Multiple Live Births  1 2 0 0 2    # Outcome Date GA Lbr Len/2nd Weight Sex Type Anes PTL Lv  6 Current           5 Term 09/17/17 [redacted]w[redacted]d 07:45 / 00:25 3039 g M Vag-Spont EPI  LIV  4 Term 11/27/14 [redacted]w[redacted]d 09:17 / 00:30 2914 g M Vag-Spont EPI  LIV     Birth Comments: WNL  3 IAB 07/2013          2 SAB           1 IAB             Social History Social History   Socioeconomic History   Marital status: Single    Spouse name: Not on file   Number of children: Not on file   Years of education: Not on file   Highest education level: Not on file  Occupational History   Not on file  Tobacco Use   Smoking status: Never   Smokeless tobacco: Never  Vaping Use   Vaping status: Never Used  Substance and Sexual Activity   Alcohol use: Not Currently    Alcohol/week: 1.0 standard drink of alcohol    Types: 1 Shots of liquor per week   Drug use: No    Comment: Reports not drinking or using any drugs   Sexual activity: Yes    Birth control/protection: None  Other Topics Concern   Not on file  Social History Narrative    Not on file   Social Drivers of Health   Financial Resource Strain: Not on file  Food Insecurity: Not on file  Transportation Needs: Not on file  Physical Activity: Not on file  Stress: Not on file  Social Connections: Not on file    Family History: Family History  Problem Relation Age of Onset   Healthy Mother    Hypertension Father    Diabetes Father    Diabetes Maternal Grandmother    Hypertension Maternal Grandmother    Diabetes Maternal Grandfather    Hypertension Maternal Grandfather    Diabetes Paternal Grandmother    Hypertension Paternal Grandmother    Diabetes Paternal Grandfather    Stroke Paternal Grandfather    Hypertension Paternal Grandfather     Allergies: No Known Allergies  Medications Prior to Admission  Medication Sig Dispense Refill Last Dose/Taking   Prenatal Vit-Fe Phos-FA-Omega (VITAFOL  GUMMIES) 3.33-0.333-34.8 MG CHEW Chew 1 tablet by mouth daily. 90 tablet 5 06/20/2024 Morning   cefadroxil  (DURICEF) 500 MG capsule  Take 1 capsule (500 mg total) by mouth 2 (two) times daily. (Patient not taking: Reported on 06/07/2024) 14 capsule 0    metroNIDAZOLE  (FLAGYL ) 500 MG tablet Take 1 tablet (500 mg total) by mouth 2 (two) times daily. (Patient not taking: Reported on 06/07/2024) 14 tablet 0    terconazole  (TERAZOL 7 ) 0.4 % vaginal cream Place 1 applicator vaginally at bedtime. (Patient not taking: Reported on 06/07/2024) 45 g 0      Review of Systems   All systems reviewed and negative except as stated in HPI  Blood pressure (!) 153/110, pulse 81, temperature 98.3 F (36.8 C), temperature source Oral, resp. rate 19, height 5' 1 (1.549 m), weight 80.5 kg, SpO2 100%. General appearance: alert, cooperative, and appears stated age Lungs: clear to auscultation bilaterally Heart: regular rate and rhythm Abdomen: soft, non-tender; bowel sounds normal Pelvic: 1/20/-3 Extremities: trace edema BLL,  no sign of DVT Presentation: cephalic, confirmed on  US  Fetal monitoringBaseline: 120 bpm, Variability: Good {> 6 bpm), Accelerations: Reactive, and Decelerations: Absent Uterine activityNone     Prenatal labs: ABO, Rh: A/Positive/-- (05/20 1356) Antibody: Negative (05/20 1356) Rubella: 7.17 (05/20 1356) RPR: Non Reactive (09/12 0847)  HBsAg: Negative (09/12 0847)  HIV: Non Reactive (09/12 0847)  GBS:      Lab Results  Component Value Date   GBS PRESUMPTIVE NEGATIVE 06/20/2024   GTT normal Genetic screening  NIPS LR XX Anatomy US  normal on f/u for nonvisualized structures.   Immunization History  Administered Date(s) Administered   Tdap 01/16/2017     Results for orders placed or performed during the hospital encounter of 06/20/24 (from the past 24 hours)  Urinalysis, Routine w reflex microscopic -Urine, Clean Catch   Collection Time: 06/20/24  9:04 AM  Result Value Ref Range   Color, Urine AMBER (A) YELLOW   APPearance CLOUDY (A) CLEAR   Specific Gravity, Urine 1.021 1.005 - 1.030   pH 5.0 5.0 - 8.0   Glucose, UA NEGATIVE NEGATIVE mg/dL   Hgb urine dipstick NEGATIVE NEGATIVE   Bilirubin Urine NEGATIVE NEGATIVE   Ketones, ur NEGATIVE NEGATIVE mg/dL   Protein, ur 899 (A) NEGATIVE mg/dL   Nitrite NEGATIVE NEGATIVE   Leukocytes,Ua LARGE (A) NEGATIVE   RBC / HPF 0-5 0 - 5 RBC/hpf   WBC, UA 0-5 0 - 5 WBC/hpf   Bacteria, UA RARE (A) NONE SEEN   Squamous Epithelial / HPF 21-50 0 - 5 /HPF   Mucus PRESENT   Protein / creatinine ratio, urine   Collection Time: 06/20/24  9:06 AM  Result Value Ref Range   Creatinine, Urine 289 mg/dL   Total Protein, Urine 66 mg/dL   Protein Creatinine Ratio 0.23 (H) 0.00 - 0.15 mg/mg[Cre]  CBC   Collection Time: 06/20/24  9:21 AM  Result Value Ref Range   WBC 5.9 4.0 - 10.5 K/uL   RBC 4.06 3.87 - 5.11 MIL/uL   Hemoglobin 11.8 (L) 12.0 - 15.0 g/dL   HCT 65.0 (L) 63.9 - 53.9 %   MCV 86.0 80.0 - 100.0 fL   MCH 29.1 26.0 - 34.0 pg   MCHC 33.8 30.0 - 36.0 g/dL   RDW 87.0 88.4 - 84.4 %    Platelets 184 150 - 400 K/uL   nRBC 0.0 0.0 - 0.2 %  Comprehensive metabolic panel with GFR   Collection Time: 06/20/24  9:21 AM  Result Value Ref Range   Sodium 136 135 - 145 mmol/L   Potassium 3.8 3.5 - 5.1  mmol/L   Chloride 104 98 - 111 mmol/L   CO2 21 (L) 22 - 32 mmol/L   Glucose, Bld 108 (H) 70 - 99 mg/dL   BUN 8 6 - 20 mg/dL   Creatinine, Ser 9.31 0.44 - 1.00 mg/dL   Calcium  8.1 (L) 8.9 - 10.3 mg/dL   Total Protein 6.2 (L) 6.5 - 8.1 g/dL   Albumin 2.5 (L) 3.5 - 5.0 g/dL   AST 14 (L) 15 - 41 U/L   ALT 9 0 - 44 U/L   Alkaline Phosphatase 207 (H) 38 - 126 U/L   Total Bilirubin 0.3 0.0 - 1.2 mg/dL   GFR, Estimated >39 >39 mL/min   Anion gap 11 5 - 15    Patient Active Problem List   Diagnosis Date Noted   LGSIL on Pap smear of cervix 01/14/2024   Elevated blood pressure reading 01/06/2024   Supervision of other normal pregnancy, antepartum 12/09/2023   UTI (urinary tract infection) 11/11/2023   Migraine 10/14/2023    Assessment/Plan:  [name] Schaefer is a 28 y.o. [id number] at [redacted]w[redacted]d here for IOL in the setting of preE with severe features after having 2 elevated blood pressure readings and 2 days of a new onset headache.   #Labor: Admitting for IOL. Cervix currently 1/20/ballotable. Placed a Cooks catheter.  #Pain: Will be using an epidural.  #FWB: CAT I #GBS status: Presumptive Neg on PCR today. #Feeding: Formula #Reproductive Life planning: Inpatient Nexplanon  #Circ:  not applicable  #preE w/SF - magnesium  gtt, seizure precautions ordered - labs q12h, 5A/5P - PRN antiHTN ordered, rec'd hydral in MAU  Lavanda FORBES Aline, MD  06/20/2024, 10:33 AM   Attestation of Supervision of Resident: Evaluation and management procedures were performed by the learners: Family Medicine Resident under my supervision. I was immediately available for direct supervision, assistance and direction throughout this encounter.  I also confirm that I have verified the information documented in  the resident's note, and that I have also personally reperformed the pertinent components of the physical exam and all of the medical decision making activities.  I have also made any necessary editorial changes.  Barabara Maier, DO FM-OB Fellow Center for [employer]

## 2024-06-20 NOTE — H&P (Signed)
 OBSTETRIC ADMISSION HISTORY AND PHYSICAL  [name] Schaefer is a 28 y.o. female [phone] with IUP at [redacted]w[redacted]d by 8wk US  presenting to maternity admissions reporting 1 day of migraine headaches and multiple episodes of vomiting yesterday and this morning. Last episode of vomiting around 7am. Denies blood in the emesis. Denies taking any medications. Was able to eat chips this morning after vomiting and hasn't vomited since. Describes fatigue and lower abdominal cramping. Does have headache, 7/10 on the pain scale, having photosensitivity. Denies decreased fetal movement, vaginal bleeding or leakage of fluid. She plans on bottle feeding. She request Nexplanon  for birth control.  She received her prenatal care at [facility]   Dating: By 8wk US  --->  Estimated Date of Delivery: 07/20/24  Sono:  last done @[redacted]w[redacted]d , CWD, normal anatomy, cephalic presentation, 894g, 31% EFW   Prenatal History/Complications: PEC w/ severe features  Past Medical History: Past Medical History:  Diagnosis Date   Anxiety    Depression     Past Surgical History: Past Surgical History:  Procedure Laterality Date   INDUCED ABORTION      Obstetrical History: OB History     Gravida  6   Para  2   Term  2   Preterm  0   AB  3   Living  2      SAB  1   IAB  2   Ectopic  0   Multiple  0   Live Births  2           Social History Social History   Socioeconomic History   Marital status: Single    Spouse name: Not on file   Number of children: Not on file   Years of education: Not on file   Highest education level: Not on file  Occupational History   Not on file  Tobacco Use   Smoking status: Never   Smokeless tobacco: Never  Vaping Use   Vaping status: Never Used  Substance and Sexual Activity   Alcohol use: Not Currently    Alcohol/week: 1.0 standard drink of alcohol    Types: 1 Shots of liquor per week   Drug use: No    Comment: Reports not drinking or using any drugs   Sexual activity:  Yes    Birth control/protection: None  Other Topics Concern   Not on file  Social History Narrative   Not on file   Social Drivers of Health   Financial Resource Strain: Not on file  Food Insecurity: Not on file  Transportation Needs: Not on file  Physical Activity: Not on file  Stress: Not on file  Social Connections: Not on file    Family History: Family History  Problem Relation Age of Onset   Healthy Mother    Hypertension Father    Diabetes Father    Diabetes Maternal Grandmother    Hypertension Maternal Grandmother    Diabetes Maternal Grandfather    Hypertension Maternal Grandfather    Diabetes Paternal Grandmother    Hypertension Paternal Grandmother    Diabetes Paternal Grandfather    Stroke Paternal Grandfather    Hypertension Paternal Grandfather     Allergies: No Known Allergies  Medications Prior to Admission  Medication Sig Dispense Refill Last Dose/Taking   Prenatal Vit-Fe Phos-FA-Omega (VITAFOL  GUMMIES) 3.33-0.333-34.8 MG CHEW Chew 1 tablet by mouth daily. 90 tablet 5 06/20/2024 Morning   cefadroxil  (DURICEF) 500 MG capsule Take 1 capsule (500 mg total) by mouth 2 (two) times daily. (Patient  not taking: Reported on 06/07/2024) 14 capsule 0    metroNIDAZOLE  (FLAGYL ) 500 MG tablet Take 1 tablet (500 mg total) by mouth 2 (two) times daily. (Patient not taking: Reported on 06/07/2024) 14 tablet 0    terconazole  (TERAZOL 7 ) 0.4 % vaginal cream Place 1 applicator vaginally at bedtime. (Patient not taking: Reported on 06/07/2024) 45 g 0      Review of Systems   All systems reviewed and negative except as stated in HPI  Blood pressure (!) 161/104, pulse 71, temperature 98.3 F (36.8 C), temperature source Oral, resp. rate 19, height 5' 1 (1.549 m), weight 80.5 kg, SpO2 100%. General appearance: alert, cooperative, appears stated age, and no distress Lungs: clear to auscultation bilaterally Heart: regular rate and rhythm Abdomen: soft, non-tender; bowel  sounds normal Pelvic: deferred Extremities: Homans sign is negative, no sign of DVT Presentation: cephalic Fetal monitoringBaseline: 130 bpm, Variability: Good {> 6 bpm), Accelerations: Reactive, and Decelerations: Absent Uterine activity: Irritability     Prenatal labs: ABO, Rh: A/Positive/-- (05/20 1356) Antibody: Negative (05/20 1356) Rubella: 7.17 (05/20 1356) RPR: Non Reactive (09/12 0847)  HBsAg: Negative (09/12 0847)  HIV: Non Reactive (09/12 0847)  GBS: unknown, rapid pending  GTT normal Genetic screening  low-risk Anatomy US  normal  Immunization History  Administered Date(s) Administered   Tdap 01/16/2017    Prenatal Transfer Tool  Maternal Diabetes: No Genetic Screening: Normal Maternal Ultrasounds/Referrals: Normal Fetal Ultrasounds or other Referrals:  None Maternal Substance Abuse:  No Significant Maternal Medications:  None Significant Maternal Lab Results: Other: GBS unknown Number of Prenatal Visits:greater than 3 verified prenatal visits Maternal Vaccinations:None Other Comments:  None   Results for orders placed or performed during the hospital encounter of 06/20/24 (from the past 24 hours)  Urinalysis, Routine w reflex microscopic -Urine, Clean Catch   Collection Time: 06/20/24  9:04 AM  Result Value Ref Range   Color, Urine AMBER (A) YELLOW   APPearance CLOUDY (A) CLEAR   Specific Gravity, Urine 1.021 1.005 - 1.030   pH 5.0 5.0 - 8.0   Glucose, UA NEGATIVE NEGATIVE mg/dL   Hgb urine dipstick NEGATIVE NEGATIVE   Bilirubin Urine NEGATIVE NEGATIVE   Ketones, ur NEGATIVE NEGATIVE mg/dL   Protein, ur 899 (A) NEGATIVE mg/dL   Nitrite NEGATIVE NEGATIVE   Leukocytes,Ua LARGE (A) NEGATIVE   RBC / HPF 0-5 0 - 5 RBC/hpf   WBC, UA 0-5 0 - 5 WBC/hpf   Bacteria, UA RARE (A) NONE SEEN   Squamous Epithelial / HPF 21-50 0 - 5 /HPF   Mucus PRESENT   Protein / creatinine ratio, urine   Collection Time: 06/20/24  9:06 AM  Result Value Ref Range    Creatinine, Urine 289 mg/dL   Total Protein, Urine 66 mg/dL   Protein Creatinine Ratio 0.23 (H) 0.00 - 0.15 mg/mg[Cre]  CBC   Collection Time: 06/20/24  9:21 AM  Result Value Ref Range   WBC 5.9 4.0 - 10.5 K/uL   RBC 4.06 3.87 - 5.11 MIL/uL   Hemoglobin 11.8 (L) 12.0 - 15.0 g/dL   HCT 65.0 (L) 63.9 - 53.9 %   MCV 86.0 80.0 - 100.0 fL   MCH 29.1 26.0 - 34.0 pg   MCHC 33.8 30.0 - 36.0 g/dL   RDW 87.0 88.4 - 84.4 %   Platelets 184 150 - 400 K/uL   nRBC 0.0 0.0 - 0.2 %  Comprehensive metabolic panel with GFR   Collection Time: 06/20/24  9:21 AM  Result Value  Ref Range   Sodium 136 135 - 145 mmol/L   Potassium 3.8 3.5 - 5.1 mmol/L   Chloride 104 98 - 111 mmol/L   CO2 21 (L) 22 - 32 mmol/L   Glucose, Bld 108 (H) 70 - 99 mg/dL   BUN 8 6 - 20 mg/dL   Creatinine, Ser 9.31 0.44 - 1.00 mg/dL   Calcium  8.1 (L) 8.9 - 10.3 mg/dL   Total Protein 6.2 (L) 6.5 - 8.1 g/dL   Albumin 2.5 (L) 3.5 - 5.0 g/dL   AST 14 (L) 15 - 41 U/L   ALT 9 0 - 44 U/L   Alkaline Phosphatase 207 (H) 38 - 126 U/L   Total Bilirubin 0.3 0.0 - 1.2 mg/dL   GFR, Estimated >39 >39 mL/min   Anion gap 11 5 - 15    Patient Active Problem List   Diagnosis Date Noted   LGSIL on Pap smear of cervix 01/14/2024   Elevated blood pressure reading 01/06/2024   Supervision of other normal pregnancy, antepartum 12/09/2023   UTI (urinary tract infection) 11/11/2023   Migraine 10/14/2023    Assessment/Plan:  Dorthy R Sudbury is a 28 y.o. [id number] at [redacted]w[redacted]d here for IOL for PEC w/ SF  #Labor: IOL #Pain: Per patient preference #FWB: Category I #GBS status:  Unknown, rapid swab collected #Feeding: Formula #Reproductive Life planning: Nexplanon  #Circ:  not applicable  #PEC w/ SF: Had 2 severe range pressures in the MAU, started on hydralazine protocol and magnesium , initial labs overall negative   Charlie DELENA Courts, MD  06/20/2024, 10:27 AM

## 2024-06-20 NOTE — Anesthesia Preprocedure Evaluation (Signed)
 Anesthesia Evaluation  Patient identified by MRN, date of birth, ID band Patient awake    Reviewed: Allergy & Precautions, Patient's Chart, lab work & pertinent test results  Airway Mallampati: II       Dental no notable dental hx.    Pulmonary    Pulmonary exam normal        Cardiovascular hypertension, Normal cardiovascular exam     Neuro/Psych  Headaches PSYCHIATRIC DISORDERS Anxiety Depression       GI/Hepatic negative GI ROS, Neg liver ROS,,,  Endo/Other    Renal/GU negative Renal ROS     Musculoskeletal   Abdominal   Peds  Hematology   Anesthesia Other Findings   Reproductive/Obstetrics (+) Pregnancy                              Anesthesia Physical Anesthesia Plan  ASA: 2  Anesthesia Plan: Epidural   Post-op Pain Management:    Induction:   PONV Risk Score and Plan: 0  Airway Management Planned: Natural Airway  Additional Equipment: None  Intra-op Plan:   Post-operative Plan:   Informed Consent: I have reviewed the patients History and Physical, chart, labs and discussed the procedure including the risks, benefits and alternatives for the proposed anesthesia with the patient or authorized representative who has indicated his/her understanding and acceptance.       Plan Discussed with:   Anesthesia Plan Comments: (Lab Results      Component                Value               Date                      WBC                      PENDING             06/20/2024                HGB                      12.7                06/20/2024                HCT                      37.5                06/20/2024                MCV                      85.0                06/20/2024                PLT                      193                 06/20/2024           )        Anesthesia Quick Evaluation

## 2024-06-21 ENCOUNTER — Encounter: Admitting: Obstetrics

## 2024-06-21 ENCOUNTER — Encounter (HOSPITAL_COMMUNITY): Payer: Self-pay | Admitting: Obstetrics and Gynecology

## 2024-06-21 DIAGNOSIS — O1414 Severe pre-eclampsia complicating childbirth: Secondary | ICD-10-CM

## 2024-06-21 DIAGNOSIS — Z3A35 35 weeks gestation of pregnancy: Secondary | ICD-10-CM

## 2024-06-21 LAB — CBC
HCT: 39.6 % (ref 36.0–46.0)
HCT: 34.6 % — ABNORMAL LOW (ref 36.0–46.0)
Hemoglobin: 12.7 g/dL (ref 12.0–15.0)
Hemoglobin: 11.9 g/dL — ABNORMAL LOW (ref 12.0–15.0)
MCH: 28.9 pg (ref 26.0–34.0)
MCH: 29.2 pg (ref 26.0–34.0)
MCHC: 32.1 g/dL (ref 30.0–36.0)
MCHC: 34.4 g/dL (ref 30.0–36.0)
MCV: 90.2 fL (ref 80.0–100.0)
MCV: 84.8 fL (ref 80.0–100.0)
Platelets: 189 K/uL (ref 150–400)
Platelets: 194 K/uL (ref 150–400)
RBC: 4.39 MIL/uL (ref 3.87–5.11)
RBC: 4.08 MIL/uL (ref 3.87–5.11)
RDW: 13.5 % (ref 11.5–15.5)
RDW: 13.1 % (ref 11.5–15.5)
WBC: 8.4 K/uL (ref 4.0–10.5)
WBC: 11.5 K/uL — ABNORMAL HIGH (ref 4.0–10.5)
nRBC: 0 % (ref 0.0–0.2)
nRBC: 0 % (ref 0.0–0.2)

## 2024-06-21 LAB — COMPREHENSIVE METABOLIC PANEL WITH GFR
ALT: 9 U/L (ref 0–44)
ALT: 10 U/L (ref 0–44)
AST: 15 U/L (ref 15–41)
AST: 24 U/L (ref 15–41)
Albumin: 2.6 g/dL — ABNORMAL LOW (ref 3.5–5.0)
Albumin: 2.3 g/dL — ABNORMAL LOW (ref 3.5–5.0)
Alkaline Phosphatase: 245 U/L — ABNORMAL HIGH (ref 38–126)
Alkaline Phosphatase: 220 U/L — ABNORMAL HIGH (ref 38–126)
Anion gap: 12 (ref 5–15)
Anion gap: 11 (ref 5–15)
BUN: 5 mg/dL — ABNORMAL LOW (ref 6–20)
BUN: <5 mg/dL — ABNORMAL LOW (ref 6–20)
CO2: 19 mmol/L — ABNORMAL LOW (ref 22–32)
CO2: 19 mmol/L — ABNORMAL LOW (ref 22–32)
Calcium: 7 mg/dL — ABNORMAL LOW (ref 8.9–10.3)
Calcium: 6.7 mg/dL — ABNORMAL LOW (ref 8.9–10.3)
Chloride: 101 mmol/L (ref 98–111)
Chloride: 103 mmol/L (ref 98–111)
Creatinine, Ser: 0.76 mg/dL (ref 0.44–1.00)
Creatinine, Ser: 0.63 mg/dL (ref 0.44–1.00)
GFR, Estimated: >60 mL/min (ref >60)
GFR, Estimated: >60 mL/min (ref >60)
Glucose, Bld: 99 mg/dL (ref 70–99)
Glucose, Bld: 125 mg/dL — ABNORMAL HIGH (ref 70–99)
Potassium: 3.9 mmol/L (ref 3.5–5.1)
Potassium: 3.8 mmol/L (ref 3.5–5.1)
Sodium: 132 mmol/L — ABNORMAL LOW (ref 135–145)
Sodium: 133 mmol/L — ABNORMAL LOW (ref 135–145)
Total Bilirubin: 0.6 mg/dL (ref 0.0–1.2)
Total Bilirubin: 0.9 mg/dL (ref 0.0–1.2)
Total Protein: 6.7 g/dL (ref 6.5–8.1)
Total Protein: 6.1 g/dL — ABNORMAL LOW (ref 6.5–8.1)

## 2024-06-21 LAB — MAGNESIUM
Magnesium: 6 mg/dL — ABNORMAL HIGH (ref 1.7–2.4)
Magnesium: 5.9 mg/dL — ABNORMAL HIGH (ref 1.7–2.4)

## 2024-06-21 MED ORDER — SODIUM CHLORIDE 0.9% FLUSH
3.0000 mL | Freq: Two times a day (BID) | INTRAVENOUS | Status: DC
  Administered 2024-06-21 – 2024-06-23 (×4): 3 mL via INTRAVENOUS

## 2024-06-21 MED ORDER — IBUPROFEN 800 MG PO TABS
800.0000 mg | ORAL_TABLET | Freq: Three times a day (TID) | ORAL | Status: DC
  Administered 2024-06-21 – 2024-06-24 (×11): 800 mg via ORAL
  Filled 2024-06-21 (×11): qty 1

## 2024-06-21 MED ORDER — PRENATAL MULTIVITAMIN CH
1.0000 | ORAL_TABLET | Freq: Every day | ORAL | Status: DC
  Administered 2024-06-21 – 2024-06-24 (×4): 1 via ORAL
  Filled 2024-06-21 (×4): qty 1

## 2024-06-21 MED ORDER — DIBUCAINE (PERIANAL) 1 % EX OINT: 1.0000 | TOPICAL_OINTMENT | CUTANEOUS | Status: DC | PRN

## 2024-06-21 MED ORDER — ACETAMINOPHEN 325 MG PO TABS: 650.0000 mg | ORAL_TABLET | ORAL | Status: DC | PRN

## 2024-06-21 MED ORDER — ONDANSETRON HCL 4 MG/2ML IJ SOLN: 4.0000 mg | INTRAMUSCULAR | Status: DC | PRN

## 2024-06-21 MED ORDER — BENZOCAINE-MENTHOL 20-0.5 % EX AERO: 1.0000 | INHALATION_SPRAY | CUTANEOUS | Status: DC | PRN

## 2024-06-21 MED ORDER — SENNOSIDES-DOCUSATE SODIUM 8.6-50 MG PO TABS
2.0000 | ORAL_TABLET | ORAL | Status: DC
  Administered 2024-06-21 – 2024-06-22 (×2): 2 via ORAL
  Filled 2024-06-21 (×3): qty 2

## 2024-06-21 MED ORDER — OXYCODONE HCL 5 MG PO TABS
5.0000 mg | ORAL_TABLET | Freq: Four times a day (QID) | ORAL | Status: DC | PRN
  Administered 2024-06-22: 5 mg via ORAL
  Filled 2024-06-21: qty 1

## 2024-06-21 MED ORDER — SODIUM CHLORIDE 0.9% FLUSH: 3.0000 mL | INTRAVENOUS | Status: DC | PRN

## 2024-06-21 MED ORDER — LACTATED RINGERS IV SOLN: INTRAVENOUS | Status: AC

## 2024-06-21 MED ORDER — TETANUS-DIPHTH-ACELL PERTUSSIS 5-2-15.5 LF-MCG/0.5 IM SUSP
0.5000 mL | Freq: Once | INTRAMUSCULAR | Status: AC
  Administered 2024-06-24: 0.5 mL via INTRAMUSCULAR
  Filled 2024-06-21: qty 0.5

## 2024-06-21 MED ORDER — SIMETHICONE 80 MG PO CHEW: 80.0000 mg | CHEWABLE_TABLET | ORAL | Status: DC | PRN

## 2024-06-21 MED ORDER — POTASSIUM CHLORIDE CRYS ER 20 MEQ PO TBCR
20.0000 meq | EXTENDED_RELEASE_TABLET | Freq: Every day | ORAL | Status: DC
  Administered 2024-06-21 – 2024-06-24 (×4): 20 meq via ORAL
  Filled 2024-06-21 (×4): qty 1

## 2024-06-21 MED ORDER — ONDANSETRON HCL 4 MG PO TABS: 4.0000 mg | ORAL_TABLET | ORAL | Status: DC | PRN

## 2024-06-21 MED ORDER — SODIUM CHLORIDE 0.9 % IV SOLN
250.0000 mL | INTRAVENOUS | Status: DC | PRN
Start: 2024-06-21 — End: 2024-06-24

## 2024-06-21 MED ORDER — MEASLES, MUMPS & RUBELLA VAC ~~LOC~~ SUSR: 0.5000 mL | Freq: Once | SUBCUTANEOUS | Status: DC

## 2024-06-21 MED ORDER — WITCH HAZEL-GLYCERIN EX PADS: 1.0000 | MEDICATED_PAD | CUTANEOUS | Status: DC | PRN

## 2024-06-21 MED ORDER — FUROSEMIDE 20 MG PO TABS
20.0000 mg | ORAL_TABLET | Freq: Every day | ORAL | Status: DC
  Administered 2024-06-21 – 2024-06-24 (×4): 20 mg via ORAL
  Filled 2024-06-21 (×4): qty 1

## 2024-06-21 MED ORDER — COCONUT OIL OIL: 1.0000 | TOPICAL_OIL | Status: DC | PRN

## 2024-06-21 MED ORDER — BUPIVACAINE HCL (PF) 0.25 % IJ SOLN
INTRAMUSCULAR | Status: DC | PRN
  Administered 2024-06-21 (×2): 2.5 mL via EPIDURAL

## 2024-06-21 MED ORDER — DIPHENHYDRAMINE HCL 25 MG PO CAPS: 25.0000 mg | ORAL_CAPSULE | Freq: Four times a day (QID) | ORAL | Status: DC | PRN

## 2024-06-21 NOTE — Plan of Care (Signed)
  Problem: Education: Goal: Knowledge of disease or condition will improve Outcome: Progressing Goal: Knowledge of the prescribed therapeutic regimen will improve Outcome: Progressing   Problem: Fluid Volume: Goal: Peripheral tissue perfusion will improve Outcome: Progressing   Problem: Clinical Measurements: Goal: Complications related to disease process, condition or treatment will be avoided or minimized Outcome: Progressing   Problem: Clinical Measurements: Goal: Ability to maintain clinical measurements within normal limits will improve Outcome: Progressing Goal: Will remain free from infection Outcome: Progressing Goal: Diagnostic test results will improve Outcome: Progressing Goal: Respiratory complications will improve Outcome: Progressing Goal: Cardiovascular complication will be avoided Outcome: Progressing   Problem: Activity: Goal: Risk for activity intolerance will decrease Outcome: Progressing   Problem: Nutrition: Goal: Adequate nutrition will be maintained Outcome: Progressing   Problem: Coping: Goal: Level of anxiety will decrease Outcome: Progressing   Problem: Elimination: Goal: Will not experience complications related to bowel motility Outcome: Progressing Goal: Will not experience complications related to urinary retention Outcome: Progressing   Problem: Pain Managment: Goal: General experience of comfort will improve and/or be controlled Outcome: Progressing   Problem: Safety: Goal: Ability to remain free from injury will improve Outcome: Progressing   Problem: Skin Integrity: Goal: Risk for impaired skin integrity will decrease Outcome: Progressing   Problem: Education: Goal: Knowledge of condition will improve Outcome: Progressing   Problem: Activity: Goal: Will verbalize the importance of balancing activity with adequate rest periods Outcome: Progressing Goal: Ability to tolerate increased activity will improve Outcome:  Progressing   Problem: Coping: Goal: Ability to identify and utilize available resources and services will improve Outcome: Progressing   Problem: Life Cycle: Goal: Chance of risk for complications during the postpartum period will decrease Outcome: Progressing   Problem: Role Relationship: Goal: Ability to demonstrate positive interaction with newborn will improve Outcome: Progressing   Problem: Skin Integrity: Goal: Demonstration of wound healing without infection will improve Outcome: Progressing

## 2024-06-21 NOTE — Lactation Note (Signed)
 This note was copied from a baby's chart. Lactation Consultation Note  Patient Name: [name] Schaefer Today's Date: 06/21/2024 Age:27 hours    LC spoke with Nathanel HERO RN on [facility] and confirmed that she was asked about pumping and Mom declined saying she plans to formula feed her baby.  Lactation Services are complete.   Consult Status Consult Status: Complete    [name] BRAVO 06/21/2024, 10:24 AM

## 2024-06-21 NOTE — Progress Notes (Signed)
 Labor Progress Note [name] Schaefer is a 28 y.o. [id number] at [redacted]w[redacted]d presented for IOL in setting of PEC w/ SF.   S: Doing well.  O:  BP 115/71   Pulse 100   Temp 98.2 F (36.8 C) (Oral)   Resp 18   Ht 5' 1 (1.549 m)   Wt 80.5 kg   LMP  (LMP Unknown)   SpO2 99%   BMI 33.52 kg/m  EFM: 115/Moderate Variability/Accelerations (+),Decelerations (-)  CVE: Dilation: 3 Effacement (%): 50 Cervical Position: Posterior Station: -3 Presentation: Vertex Exam by:: Dr. Danny   A&P: 28 y.o. [id number] [redacted]w[redacted]d  #Labor: Progressing well. AROM performed. Clear fluid #Pain: Epidural in place #FWB: Category I #GBS negative #Preterm labor: Ppx penicillin  #PEC w/ SF: On magnesium . Hydralazine/labetalol PRN for severe range pressures. CBC/CMP BID. Continue nifedipine  30mg  XR BID.   Aanvi Voyles LITTIE Angles, MD 2:12 AM

## 2024-06-21 NOTE — Discharge Summary (Signed)
 Postpartum Discharge Summary  Date of Service updated***     Patient Name: [name] Schaefer DOB: [birth year] MRN: [id number]  Date of admission: 06/20/2024 Delivery date:06/21/2024 Delivering provider: MAGALI BARKLEY CROME Date of discharge: 06/21/2024  Admitting diagnosis: Encounter for induction of labor [Z34.90] Pre-eclampsia, severe, third trimester [O14.13] Intrauterine pregnancy: [redacted]w[redacted]d     Secondary diagnosis:  Principal Problem:   Encounter for induction of labor Active Problems:   Migraine   Severe preeclampsia   Pre-eclampsia, severe, third trimester  Additional problems: ***    Discharge diagnosis: {DX.:23714}                                              Post partum procedures:{Postpartum procedures:23558} Augmentation: AROM, Cytotec, and IP Foley Complications: None  Hospital course: Induction of Labor With Vaginal Delivery   28 y.o. yo H3E7967 at [redacted]w[redacted]d was admitted to the hospital 06/20/2024 for induction of labor.  Indication for induction: Preeclampsia.  Patient had a labor course complicated by PEC w/ SF. On magnesium . Required multiple rounds of hydralazine.  Membrane Rupture Time/Date: 12:36 AM,06/21/2024  Delivery Method:Vaginal, Spontaneous Operative Delivery:N/A Episiotomy: None Lacerations:  None Details of delivery can be found in separate delivery note.  Patient had a postpartum course complicated by***. Patient is discharged home 06/21/24.  Newborn Data: Birth date:06/21/2024 Birth time:5:43 AM Gender:Female Living status:Living Apgars: ,  Weight:1970 g  Magnesium  Sulfate received: {Mag received:30440022} BMZ received: No Rhophylac:N/A MMR:N/A T-DaP:{Tdap:23962} Flu: No RSV Vaccine received: No Transfusion:{Transfusion received:30440034}  Immunizations received: Immunization History  Administered Date(s) Administered   Tdap 01/16/2017    Physical exam  Vitals:   06/21/24 0520 06/21/24 0525 06/21/24 0546 06/21/24 0600  BP: 133/88 133/84  127/84 126/69  Pulse: 100 92 (!) 107 (!) 117  Resp:      Temp:      TempSrc:      SpO2: 99% 97% 100% 100%  Weight:      Height:       General: {Exam; general:21111117} Lochia: {Desc; appropriate/inappropriate:30686::appropriate} Uterine Fundus: {Desc; firm/soft:30687} Incision: {Exam; incision:21111123} DVT Evaluation: {Exam; dvt:2111122} Labs: Lab Results  Component Value Date   WBC 8.4 06/21/2024   HGB 12.7 06/21/2024   HCT 39.6 06/21/2024   MCV 90.2 06/21/2024   PLT 189 06/21/2024      Latest Ref Rng & Units 06/20/2024    4:18 PM  CMP  Glucose 70 - 99 mg/dL 91   BUN 6 - 20 mg/dL 5   Creatinine 9.55 - 8.99 mg/dL 9.37   Sodium 864 - 854 mmol/L 134   Potassium 3.5 - 5.1 mmol/L 3.7   Chloride 98 - 111 mmol/L 103   CO2 22 - 32 mmol/L 21   Calcium  8.9 - 10.3 mg/dL 7.7   Total Protein 6.5 - 8.1 g/dL 6.7   Total Bilirubin 0.0 - 1.2 mg/dL 0.5   Alkaline Phos 38 - 126 U/L 244   AST 15 - 41 U/L 15   ALT 0 - 44 U/L 9    Edinburgh Score:    10/20/2017   10:06 AM  Edinburgh Postnatal Depression Scale Screening Tool  I have been able to laugh and see the funny side of things. 0   I have looked forward with enjoyment to things. 0   I have blamed myself unnecessarily when things went wrong. 0   I have been  anxious or worried for no good reason. 0   I have felt scared or panicky for no good reason. 0   Things have been getting on top of me. 0   I have been so unhappy that I have had difficulty sleeping. 0   I have felt sad or miserable. 0   I have been so unhappy that I have been crying. 0   The thought of harming myself has occurred to me. 0   Edinburgh Postnatal Depression Scale Total 0      Data saved with a previous flowsheet row definition   No data recorded  After visit meds:  Allergies as of 06/21/2024   No Known Allergies   Med Rec must be completed prior to using this [facility]***        Discharge home in stable condition Infant Feeding: {Baby  feeding:23562} Infant Disposition:{CHL IP OB HOME WITH FNUYZM:76418} Discharge instruction: per After Visit Summary and Postpartum booklet. Activity: Advance as tolerated. Pelvic rest for 6 weeks.  Diet: {OB ipzu:78888878} Future Appointments: Future Appointments  Date Time Provider Department Center  06/21/2024 11:15 AM Rudy Carlin LABOR, MD [facility] None   Follow up Visit:   Please schedule this patient for a In person postpartum visit in 6 weeks with the following provider: Any provider. Additional Postpartum F/U:BP check 1 week  High risk pregnancy complicated by: HTN Delivery mode:  Vaginal, Spontaneous Anticipated Birth Control:  Nexplanon    06/21/2024 [place] LITTIE Angles, MD

## 2024-06-22 LAB — CBC
HCT: 37.7 % (ref 36.0–46.0)
HCT: 33.8 % — ABNORMAL LOW (ref 36.0–46.0)
Hemoglobin: 12.8 g/dL (ref 12.0–15.0)
Hemoglobin: 11.4 g/dL — ABNORMAL LOW (ref 12.0–15.0)
MCH: 28.8 pg (ref 26.0–34.0)
MCH: 29.1 pg (ref 26.0–34.0)
MCHC: 34 g/dL (ref 30.0–36.0)
MCHC: 33.7 g/dL (ref 30.0–36.0)
MCV: 84.7 fL (ref 80.0–100.0)
MCV: 86.2 fL (ref 80.0–100.0)
Platelets: 207 K/uL (ref 150–400)
Platelets: 207 K/uL (ref 150–400)
RBC: 4.45 MIL/uL (ref 3.87–5.11)
RBC: 3.92 MIL/uL (ref 3.87–5.11)
RDW: 13.3 % (ref 11.5–15.5)
RDW: 13.6 % (ref 11.5–15.5)
WBC: 10.1 K/uL (ref 4.0–10.5)
WBC: 7.6 K/uL (ref 4.0–10.5)
nRBC: 0 % (ref 0.0–0.2)
nRBC: 0 % (ref 0.0–0.2)

## 2024-06-22 LAB — COMPREHENSIVE METABOLIC PANEL WITH GFR
ALT: 9 U/L (ref 0–44)
ALT: 8 U/L (ref 0–44)
AST: 17 U/L (ref 15–41)
AST: 17 U/L (ref 15–41)
Albumin: 2.4 g/dL — ABNORMAL LOW (ref 3.5–5.0)
Albumin: 2.4 g/dL — ABNORMAL LOW (ref 3.5–5.0)
Alkaline Phosphatase: 220 U/L — ABNORMAL HIGH (ref 38–126)
Alkaline Phosphatase: 181 U/L — ABNORMAL HIGH (ref 38–126)
Anion gap: 9 (ref 5–15)
Anion gap: 9 (ref 5–15)
BUN: <5 mg/dL — ABNORMAL LOW (ref 6–20)
BUN: 5 mg/dL — ABNORMAL LOW (ref 6–20)
CO2: 20 mmol/L — ABNORMAL LOW (ref 22–32)
CO2: 22 mmol/L (ref 22–32)
Calcium: 6.4 mg/dL — CL (ref 8.9–10.3)
Calcium: 7.3 mg/dL — ABNORMAL LOW (ref 8.9–10.3)
Chloride: 104 mmol/L (ref 98–111)
Chloride: 103 mmol/L (ref 98–111)
Creatinine, Ser: 0.6 mg/dL (ref 0.44–1.00)
Creatinine, Ser: 0.76 mg/dL (ref 0.44–1.00)
GFR, Estimated: >60 mL/min (ref >60)
GFR, Estimated: >60 mL/min (ref >60)
Glucose, Bld: 89 mg/dL (ref 70–99)
Glucose, Bld: 102 mg/dL — ABNORMAL HIGH (ref 70–99)
Potassium: 3.8 mmol/L (ref 3.5–5.1)
Potassium: 4 mmol/L (ref 3.5–5.1)
Sodium: 133 mmol/L — ABNORMAL LOW (ref 135–145)
Sodium: 134 mmol/L — ABNORMAL LOW (ref 135–145)
Total Bilirubin: 0.3 mg/dL (ref 0.0–1.2)
Total Bilirubin: 0.3 mg/dL (ref 0.0–1.2)
Total Protein: 6.5 g/dL (ref 6.5–8.1)
Total Protein: 6 g/dL — ABNORMAL LOW (ref 6.5–8.1)

## 2024-06-22 LAB — SURGICAL PATHOLOGY

## 2024-06-22 LAB — MAGNESIUM: Magnesium: 5.8 mg/dL — ABNORMAL HIGH (ref 1.7–2.4)

## 2024-06-22 NOTE — Anesthesia Postprocedure Evaluation (Signed)
 Anesthesia Post Note  Patient: Maeleigh R Chichester  Procedure(s) Performed: AN AD HOC LABOR EPIDURAL     Patient location during evaluation: OB High Risk Anesthesia Type: Epidural Level of consciousness: awake, oriented and awake and alert Pain management: pain level controlled Vital Signs Assessment: post-procedure vital signs reviewed and stable Respiratory status: spontaneous breathing, nonlabored ventilation and respiratory function stable Cardiovascular status: stable Postop Assessment: no headache, patient able to bend at knees, adequate PO intake, able to ambulate and no apparent nausea or vomiting Anesthetic complications: no   No notable events documented.  Last Vitals:  Vitals:   06/22/24 0435 06/22/24 0735  BP: 134/78 112/73  Pulse: (!) 101 93  Resp: 17 17  Temp: 36.4 C 36.4 C  SpO2: 99% 100%    Last Pain:  Vitals:   06/22/24 0817  TempSrc:   PainSc: 0-No pain   Pain Goal:                   Shahida Schnackenberg
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
## 2024-06-22 NOTE — Progress Notes (Signed)
 Post Partum Day 1 Subjective: no complaints, up ad lib, voiding, and tolerating PO  Objective: Blood pressure 112/73, pulse 93, temperature 97.6 F (36.4 C), temperature source Oral, resp. rate 17, height 5' 1 (1.549 m), weight 80.5 kg, SpO2 100%, unknown if currently breastfeeding.  Physical Exam:  General: alert, cooperative, and no distress Lochia: appropriate Uterine Fundus: firm Incision:  DVT Evaluation: No evidence of DVT seen on physical exam.  Recent Labs    06/21/24 1706 06/22/24 0516  HGB 11.9* 12.8  HCT 34.6* 37.7    Assessment/Plan: Plan for discharge tomorrow  Off magnesium  Procardia  + Lasix + KCl  Track BP    LOS: 2 days   [name] VEAR Inch, MD 06/22/2024, 10:09 AM

## 2024-06-23 DIAGNOSIS — O9081 Anemia of the puerperium: Secondary | ICD-10-CM | POA: Diagnosis not present

## 2024-06-23 LAB — COMPREHENSIVE METABOLIC PANEL WITH GFR
ALT: 9 U/L (ref 0–44)
ALT: 11 U/L (ref 0–44)
AST: 12 U/L — ABNORMAL LOW (ref 15–41)
AST: 17 U/L (ref 15–41)
Albumin: 2.1 g/dL — ABNORMAL LOW (ref 3.5–5.0)
Albumin: 2.7 g/dL — ABNORMAL LOW (ref 3.5–5.0)
Alkaline Phosphatase: 145 U/L — ABNORMAL HIGH (ref 38–126)
Alkaline Phosphatase: 185 U/L — ABNORMAL HIGH (ref 38–126)
Anion gap: 10 (ref 5–15)
Anion gap: 11 (ref 5–15)
BUN: 6 mg/dL (ref 6–20)
BUN: 6 mg/dL (ref 6–20)
CO2: 22 mmol/L (ref 22–32)
CO2: 23 mmol/L (ref 22–32)
Calcium: 7.7 mg/dL — ABNORMAL LOW (ref 8.9–10.3)
Calcium: 8.4 mg/dL — ABNORMAL LOW (ref 8.9–10.3)
Chloride: 103 mmol/L (ref 98–111)
Chloride: 100 mmol/L (ref 98–111)
Creatinine, Ser: 0.71 mg/dL (ref 0.44–1.00)
Creatinine, Ser: 0.79 mg/dL (ref 0.44–1.00)
GFR, Estimated: >60 mL/min (ref >60)
GFR, Estimated: >60 mL/min (ref >60)
Glucose, Bld: 74 mg/dL (ref 70–99)
Glucose, Bld: 98 mg/dL (ref 70–99)
Potassium: 4.1 mmol/L (ref 3.5–5.1)
Potassium: 4.4 mmol/L (ref 3.5–5.1)
Sodium: 135 mmol/L (ref 135–145)
Sodium: 134 mmol/L — ABNORMAL LOW (ref 135–145)
Total Bilirubin: 0.4 mg/dL (ref 0.0–1.2)
Total Bilirubin: 0.5 mg/dL (ref 0.0–1.2)
Total Protein: 3.5 g/dL — ABNORMAL LOW (ref 6.5–8.1)
Total Protein: 6.5 g/dL (ref 6.5–8.1)

## 2024-06-23 LAB — CBC
HCT: 27 % — ABNORMAL LOW (ref 36.0–46.0)
HCT: 30.2 % — ABNORMAL LOW (ref 36.0–46.0)
Hemoglobin: 9 g/dL — ABNORMAL LOW (ref 12.0–15.0)
Hemoglobin: 10 g/dL — ABNORMAL LOW (ref 12.0–15.0)
MCH: 29 pg (ref 26.0–34.0)
MCH: 28.7 pg (ref 26.0–34.0)
MCHC: 33.3 g/dL (ref 30.0–36.0)
MCHC: 33.1 g/dL (ref 30.0–36.0)
MCV: 87.1 fL (ref 80.0–100.0)
MCV: 86.8 fL (ref 80.0–100.0)
Platelets: 175 K/uL (ref 150–400)
Platelets: 205 K/uL (ref 150–400)
RBC: 3.1 MIL/uL — ABNORMAL LOW (ref 3.87–5.11)
RBC: 3.48 MIL/uL — ABNORMAL LOW (ref 3.87–5.11)
RDW: 13.6 % (ref 11.5–15.5)
RDW: 13.5 % (ref 11.5–15.5)
WBC: 7.2 K/uL (ref 4.0–10.5)
WBC: 7.6 K/uL (ref 4.0–10.5)
nRBC: 0 % (ref 0.0–0.2)
nRBC: 0 % (ref 0.0–0.2)

## 2024-06-23 MED ORDER — NIFEDIPINE ER OSMOTIC RELEASE 60 MG PO TB24
60.0000 mg | ORAL_TABLET | Freq: Two times a day (BID) | ORAL | Status: DC
  Administered 2024-06-23 – 2024-06-24 (×3): 60 mg via ORAL
  Filled 2024-06-23 (×3): qty 1

## 2024-06-23 MED ORDER — FERROUS SULFATE 325 (65 FE) MG PO TABS
325.0000 mg | ORAL_TABLET | ORAL | Status: DC
  Administered 2024-06-23: 325 mg via ORAL
  Filled 2024-06-23: qty 1

## 2024-06-23 NOTE — Progress Notes (Signed)
 Post Partum Day 2 Subjective: No complaints, up ad lib, voiding, and tolerating PO.  Patient denies any headaches, visual symptoms, RUQ/epigastric pain or other concerning symptoms.  Baby is stable at bedside.  Objective: Blood pressure 133/82, pulse 79, temperature 97.7 F (36.5 C), temperature source Oral, resp. rate 17, height 5' 1 (1.549 m), weight 78.4 kg, SpO2 100%, unknown if currently breastfeeding. Patient Vitals for the past 24 hrs:  BP Temp Temp src Pulse Resp SpO2 Weight  06/23/24 0742 133/82 97.7 F (36.5 C) Oral 79 17 100 % --  06/23/24 0558 (!) 137/92 98 F (36.7 C) Oral 83 17 100 % --  06/23/24 0539 -- -- -- -- -- -- 78.4 kg  06/23/24 0004 (!) 138/93 -- -- 75 -- -- --  06/23/24 0003 (!) 151/99 98.3 F (36.8 C) Oral 81 18 100 % --  06/22/24 1935 (!) 139/95 98 F (36.7 C) Oral 73 17 100 % --  06/22/24 1633 126/76 98 F (36.7 C) Tympanic 90 18 100 % --  06/22/24 1200 124/79 97.8 F (36.6 C) Oral 86 18 98 % --    Physical Exam:  General: alert, cooperative, and no distress Lochia: appropriate Uterine Fundus: firm, NT DVT Evaluation: No evidence of DVT seen on physical exam.  Recent Labs    06/22/24 1718 06/23/24 0510  HGB 11.4* 9.0*  HCT 33.8* 27.0*    Assessment/Plan: Increased Procardia  to 60 mg po bid for better control Continue Lasix and KCL Patient was started on oral iron therapy for clinically significant but asymptomatic acute postpartum anemia due to expected blood loss. Desires inpatient Nexplanon , this will be placed prior to discharge. Plan to discharge to home tomorrow if BP if stable.    LOS: 3 days   Gloris Hugger, MD 06/23/2024, 9:27 AM

## 2024-06-24 LAB — CBC
HCT: 29.2 % — ABNORMAL LOW (ref 36.0–46.0)
Hemoglobin: 9.9 g/dL — ABNORMAL LOW (ref 12.0–15.0)
MCH: 29 pg (ref 26.0–34.0)
MCHC: 33.9 g/dL (ref 30.0–36.0)
MCV: 85.6 fL (ref 80.0–100.0)
Platelets: 216 K/uL (ref 150–400)
RBC: 3.41 MIL/uL — ABNORMAL LOW (ref 3.87–5.11)
RDW: 13.5 % (ref 11.5–15.5)
WBC: 6.6 K/uL (ref 4.0–10.5)
nRBC: 0 % (ref 0.0–0.2)

## 2024-06-24 LAB — COMPREHENSIVE METABOLIC PANEL WITH GFR
ALT: 10 U/L (ref 0–44)
AST: 13 U/L — ABNORMAL LOW (ref 15–41)
Albumin: 2.6 g/dL — ABNORMAL LOW (ref 3.5–5.0)
Alkaline Phosphatase: 152 U/L — ABNORMAL HIGH (ref 38–126)
Anion gap: 11 (ref 5–15)
BUN: 8 mg/dL (ref 6–20)
CO2: 22 mmol/L (ref 22–32)
Calcium: 8.2 mg/dL — ABNORMAL LOW (ref 8.9–10.3)
Chloride: 100 mmol/L (ref 98–111)
Creatinine, Ser: 0.77 mg/dL (ref 0.44–1.00)
GFR, Estimated: >60 mL/min (ref >60)
Glucose, Bld: 92 mg/dL (ref 70–99)
Potassium: 4.1 mmol/L (ref 3.5–5.1)
Sodium: 133 mmol/L — ABNORMAL LOW (ref 135–145)
Total Bilirubin: <0.2 mg/dL (ref 0.0–1.2)
Total Protein: 6.3 g/dL — ABNORMAL LOW (ref 6.5–8.1)

## 2024-06-24 MED ORDER — ETONOGESTREL 68 MG ~~LOC~~ IMPL
68.0000 mg | DRUG_IMPLANT | Freq: Once | SUBCUTANEOUS | Status: AC
  Administered 2024-06-24: 68 mg via SUBCUTANEOUS
  Filled 2024-06-24: qty 1

## 2024-06-24 MED ORDER — NIFEDIPINE ER 60 MG PO TB24: 60.0000 mg | ORAL_TABLET | Freq: Two times a day (BID) | ORAL | 1 refills | Status: AC

## 2024-06-24 MED ORDER — LIDOCAINE HCL 1 % IJ SOLN
0.0000 mL | Freq: Once | INTRAMUSCULAR | Status: AC | PRN
  Administered 2024-06-24: 3 mL via INTRADERMAL
  Filled 2024-06-24: qty 20

## 2024-06-24 MED ORDER — IBUPROFEN 800 MG PO TABS: 800.0000 mg | ORAL_TABLET | Freq: Three times a day (TID) | ORAL | 0 refills | Status: AC

## 2024-06-24 MED ORDER — FUROSEMIDE 20 MG PO TABS
20.0000 mg | ORAL_TABLET | Freq: Every day | ORAL | 0 refills | Status: AC
Start: 2024-06-24 — End: ?

## 2024-06-24 NOTE — Progress Notes (Signed)
 Patient ID: [name] Schaefer, female   DOB: 04-Apr-1996, 28 y.o.   MRN: [id number] Immediate Post-Partum Nexplanon  Insertion Procedure Note  Patient was identified.Risks and benefits of Nexplanon  reviewed.  Informed consent was signed, signed copy in chart. A time-out was performed.    The insertion site was identified 8-10 cm (3-4 inches) from the medial epicondyle of the humerus and 3-5 cm (1.25-2 inches) posterior to (below) the sulcus (groove) between the biceps and triceps muscles of the patient's left arm and marked. The site was prepped and draped in the usual sterile fashion. Pt was prepped with alcohol swab and then injected with 3 cc of 1% lidocaine . The site was prepped with betadine. Nexplanon  removed form packaging,  Device confirmed in needle, then inserted full length of needle and withdrawn per handbook instructions. Provider and patient verified presence of the implant in the woman's arm by palpation. Pt insertion site was covered with steristrips/adhesive bandage and pressure bandage. There was minimal blood loss. Patient tolerated procedure well.  Patient was given post procedure instructions and Nexplanon  user card with expiration date. Condoms were recommended for STI prevention. Patient was asked to keep the pressure dressing on for 24 hours to minimize bruising and keep the adhesive bandage on for 3-5 days. The patient verbalized understanding of the plan of care and agrees.   Lot # [id number] Expiration Date 2027/08  Eveline Lynwood MATSU, MD

## 2024-07-06 ENCOUNTER — Ambulatory Visit

## 2024-07-06 VITALS — BP 121/78 | HR 90 | Wt 160.3 lb

## 2024-07-06 DIAGNOSIS — Z0130 Encounter for examination of blood pressure without abnormal findings: Secondary | ICD-10-CM

## 2024-07-06 NOTE — Progress Notes (Signed)
..  Subjective:  [name] Schaefer is a [id number] here for BP check.  She is 2 weeks postpartum following a normal spontaneous vaginal delivery.    Hypertension ROS: Patient denies headache, visual changes, and dizziness   Objective:  BP 121/78   Pulse 90   Wt 160 lb 4.8 oz (72.7 kg)   LMP  (LMP Unknown)   BMI 30.29 kg/m   Appearance alert, well appearing, and in no distress.  Assessment:   Blood Pressure well controlled.   Plan:  Current treatment plan is effective, no change in therapy. Advised to monitor for abnormal symptoms and report to mau if they occur. PP appt is scheduled for 07/21/24   Laymon JONETTA Crigler, RN

## 2024-07-21 ENCOUNTER — Ambulatory Visit: Admitting: Obstetrics

## 2024-08-02 ENCOUNTER — Ambulatory Visit: Admitting: Obstetrics
